# Patient Record
Sex: Male | Born: 1955 | Race: White | Hispanic: No | Marital: Married | Smoking: Former smoker
Health system: Southern US, Community
[De-identification: ages and names within clinical notes are randomized; demographics above are authoritative.]

## PROBLEM LIST (undated history)

## (undated) DIAGNOSIS — F32A Depression, unspecified: Secondary | ICD-10-CM

## (undated) DIAGNOSIS — K219 Gastro-esophageal reflux disease without esophagitis: Secondary | ICD-10-CM

## (undated) DIAGNOSIS — F1011 Alcohol abuse, in remission: Secondary | ICD-10-CM

## (undated) DIAGNOSIS — Z85828 Personal history of other malignant neoplasm of skin: Secondary | ICD-10-CM

## (undated) DIAGNOSIS — R9389 Abnormal findings on diagnostic imaging of other specified body structures: Secondary | ICD-10-CM

## (undated) DIAGNOSIS — F329 Major depressive disorder, single episode, unspecified: Secondary | ICD-10-CM

## (undated) DIAGNOSIS — E785 Hyperlipidemia, unspecified: Secondary | ICD-10-CM

## (undated) DIAGNOSIS — K4020 Bilateral inguinal hernia, without obstruction or gangrene, not specified as recurrent: Secondary | ICD-10-CM

## (undated) DIAGNOSIS — M5126 Other intervertebral disc displacement, lumbar region: Secondary | ICD-10-CM

## (undated) DIAGNOSIS — I639 Cerebral infarction, unspecified: Secondary | ICD-10-CM

## (undated) DIAGNOSIS — IMO0002 Reserved for concepts with insufficient information to code with codable children: Secondary | ICD-10-CM

## (undated) DIAGNOSIS — Z8659 Personal history of other mental and behavioral disorders: Secondary | ICD-10-CM

## (undated) HISTORY — DX: Reserved for concepts with insufficient information to code with codable children: IMO0002

## (undated) HISTORY — DX: Personal history of other malignant neoplasm of skin: Z85.828

## (undated) HISTORY — DX: Personal history of other mental and behavioral disorders: Z86.59

## (undated) HISTORY — DX: Gastro-esophageal reflux disease without esophagitis: K21.9

## (undated) HISTORY — DX: Abnormal findings on diagnostic imaging of other specified body structures: R93.89

## (undated) HISTORY — DX: Alcohol abuse, in remission: F10.11

## (undated) HISTORY — DX: Major depressive disorder, single episode, unspecified: F32.9

## (undated) HISTORY — DX: Hyperlipidemia, unspecified: E78.5

## (undated) HISTORY — DX: Other intervertebral disc displacement, lumbar region: M51.26

## (undated) HISTORY — DX: Bilateral inguinal hernia, without obstruction or gangrene, not specified as recurrent: K40.20

## (undated) HISTORY — DX: Depression, unspecified: F32.A

---

## 2003-08-10 HISTORY — PX: KNEE SURGERY: SHX244

## 2006-08-09 LAB — HM COLONOSCOPY: HM COLON: NORMAL

## 2009-04-18 ENCOUNTER — Emergency Department: Payer: Self-pay | Admitting: Emergency Medicine

## 2009-04-24 ENCOUNTER — Encounter: Admission: RE | Admit: 2009-04-24 | Discharge: 2009-04-24 | Payer: Self-pay | Admitting: Family Medicine

## 2012-12-25 ENCOUNTER — Ambulatory Visit: Payer: Self-pay | Admitting: Family Medicine

## 2014-10-21 LAB — LIPID PANEL
CHOLESTEROL: 231 mg/dL — AB (ref 0–200)
HDL: 72 mg/dL — AB (ref 35–70)
LDL CALC: 144 mg/dL
TRIGLYCERIDES: 77 mg/dL (ref 40–160)

## 2014-10-21 LAB — PSA: PSA: NORMAL

## 2014-10-28 ENCOUNTER — Ambulatory Visit: Payer: Self-pay | Admitting: Family Medicine

## 2015-04-16 ENCOUNTER — Inpatient Hospital Stay (HOSPITAL_COMMUNITY): Payer: BLUE CROSS/BLUE SHIELD

## 2015-04-16 ENCOUNTER — Observation Stay (HOSPITAL_COMMUNITY)
Admission: EM | Admit: 2015-04-16 | Discharge: 2015-04-17 | DRG: 069 | Disposition: A | Discharge status: Home / Self Care | Payer: BLUE CROSS/BLUE SHIELD | Attending: Internal Medicine | Admitting: Internal Medicine

## 2015-04-16 ENCOUNTER — Other Ambulatory Visit (HOSPITAL_COMMUNITY): Payer: BLUE CROSS/BLUE SHIELD

## 2015-04-16 ENCOUNTER — Encounter (HOSPITAL_COMMUNITY): Payer: Self-pay | Admitting: *Deleted

## 2015-04-16 ENCOUNTER — Emergency Department (HOSPITAL_COMMUNITY): Payer: BLUE CROSS/BLUE SHIELD

## 2015-04-16 ENCOUNTER — Ambulatory Visit (HOSPITAL_COMMUNITY): Payer: BLUE CROSS/BLUE SHIELD

## 2015-04-16 DIAGNOSIS — G458 Other transient cerebral ischemic attacks and related syndromes: Secondary | ICD-10-CM

## 2015-04-16 DIAGNOSIS — Z87891 Personal history of nicotine dependence: Secondary | ICD-10-CM | POA: Diagnosis not present

## 2015-04-16 DIAGNOSIS — Z880 Allergy status to penicillin: Secondary | ICD-10-CM | POA: Diagnosis not present

## 2015-04-16 DIAGNOSIS — Q231 Congenital insufficiency of aortic valve: Secondary | ICD-10-CM

## 2015-04-16 DIAGNOSIS — Z7982 Long term (current) use of aspirin: Secondary | ICD-10-CM | POA: Diagnosis not present

## 2015-04-16 DIAGNOSIS — E785 Hyperlipidemia, unspecified: Secondary | ICD-10-CM | POA: Diagnosis not present

## 2015-04-16 DIAGNOSIS — Z8673 Personal history of transient ischemic attack (TIA), and cerebral infarction without residual deficits: Secondary | ICD-10-CM | POA: Diagnosis present

## 2015-04-16 DIAGNOSIS — I10 Essential (primary) hypertension: Secondary | ICD-10-CM | POA: Diagnosis not present

## 2015-04-16 DIAGNOSIS — G459 Transient cerebral ischemic attack, unspecified: Secondary | ICD-10-CM

## 2015-04-16 LAB — DIFFERENTIAL
BASOS ABS: 0 10*3/uL (ref 0.0–0.1)
Basophils Relative: 0 % (ref 0–1)
EOS ABS: 0 10*3/uL (ref 0.0–0.7)
EOS PCT: 0 % (ref 0–5)
LYMPHS ABS: 1.4 10*3/uL (ref 0.7–4.0)
Lymphocytes Relative: 21 % (ref 12–46)
MONO ABS: 0.6 10*3/uL (ref 0.1–1.0)
MONOS PCT: 9 % (ref 3–12)
Neutro Abs: 4.5 10*3/uL (ref 1.7–7.7)
Neutrophils Relative %: 70 % (ref 43–77)

## 2015-04-16 LAB — CBC
HCT: 42.4 % (ref 39.0–52.0)
HEMATOCRIT: 42.4 % (ref 39.0–52.0)
HEMOGLOBIN: 14.4 g/dL (ref 13.0–17.0)
Hemoglobin: 14.3 g/dL (ref 13.0–17.0)
MCH: 29.3 pg (ref 26.0–34.0)
MCH: 29 pg (ref 26.0–34.0)
MCHC: 34 g/dL (ref 30.0–36.0)
MCHC: 33.7 g/dL (ref 30.0–36.0)
MCV: 86.2 fL (ref 78.0–100.0)
MCV: 86 fL (ref 78.0–100.0)
Platelets: 223 10*3/uL (ref 150–400)
Platelets: 211 10*3/uL (ref 150–400)
RBC: 4.92 MIL/uL (ref 4.22–5.81)
RBC: 4.93 MIL/uL (ref 4.22–5.81)
RDW: 13.5 % (ref 11.5–15.5)
RDW: 13.3 % (ref 11.5–15.5)
WBC: 6.5 10*3/uL (ref 4.0–10.5)
WBC: 7.6 10*3/uL (ref 4.0–10.5)

## 2015-04-16 LAB — COMPREHENSIVE METABOLIC PANEL
ALT: 17 U/L (ref 17–63)
AST: 21 U/L (ref 15–41)
Albumin: 4.2 g/dL (ref 3.5–5.0)
Alkaline Phosphatase: 61 U/L (ref 38–126)
Anion gap: 7 (ref 5–15)
BILIRUBIN TOTAL: 0.4 mg/dL (ref 0.3–1.2)
BUN: 27 mg/dL — AB (ref 6–20)
CO2: 23 mmol/L (ref 22–32)
Calcium: 9.1 mg/dL (ref 8.9–10.3)
Chloride: 107 mmol/L (ref 101–111)
Creatinine, Ser: 0.78 mg/dL (ref 0.61–1.24)
Glucose, Bld: 95 mg/dL (ref 65–99)
POTASSIUM: 4 mmol/L (ref 3.5–5.1)
Sodium: 137 mmol/L (ref 135–145)
TOTAL PROTEIN: 7.3 g/dL (ref 6.5–8.1)

## 2015-04-16 LAB — URINALYSIS, ROUTINE W REFLEX MICROSCOPIC
BILIRUBIN URINE: NEGATIVE
Glucose, UA: NEGATIVE mg/dL
HGB URINE DIPSTICK: NEGATIVE
Ketones, ur: NEGATIVE mg/dL
Leukocytes, UA: NEGATIVE
Nitrite: NEGATIVE
PH: 6.5 (ref 5.0–8.0)
Protein, ur: NEGATIVE mg/dL
SPECIFIC GRAVITY, URINE: 1.02 (ref 1.005–1.030)
UROBILINOGEN UA: 1 mg/dL (ref 0.0–1.0)

## 2015-04-16 LAB — PROTIME-INR
INR: 0.94 (ref 0.00–1.49)
Prothrombin Time: 12.8 seconds (ref 11.6–15.2)

## 2015-04-16 LAB — CREATININE, SERUM
CREATININE: 0.81 mg/dL (ref 0.61–1.24)
GFR calc Af Amer: >60 mL/min (ref >60)
GFR calc non Af Amer: >60 mL/min (ref >60)

## 2015-04-16 LAB — I-STAT TROPONIN, ED: TROPONIN I, POC: 0 ng/mL (ref 0.00–0.08)

## 2015-04-16 LAB — APTT: aPTT: 29 seconds (ref 24–37)

## 2015-04-16 MED ORDER — ASPIRIN 325 MG PO TABS
325.0000 mg | ORAL_TABLET | Freq: Once | ORAL | Status: AC
  Administered 2015-04-16: 325 mg via ORAL
  Filled 2015-04-16: qty 1

## 2015-04-16 MED ORDER — STROKE: EARLY STAGES OF RECOVERY BOOK: Freq: Once | Status: DC

## 2015-04-16 MED ORDER — ENOXAPARIN SODIUM 40 MG/0.4ML ~~LOC~~ SOLN
40.0000 mg | SUBCUTANEOUS | Status: DC
  Administered 2015-04-16: 40 mg via SUBCUTANEOUS
  Filled 2015-04-16: qty 0.4

## 2015-04-16 MED ORDER — ASPIRIN 300 MG RE SUPP: 300.0000 mg | Freq: Every day | RECTAL | Status: DC

## 2015-04-16 MED ORDER — GADOBENATE DIMEGLUMINE 529 MG/ML IV SOLN
15.0000 mL | Freq: Once | INTRAVENOUS | Status: AC | PRN
  Administered 2015-04-16: 15 mL via INTRAVENOUS

## 2015-04-16 MED ORDER — ASPIRIN 325 MG PO TABS
325.0000 mg | ORAL_TABLET | Freq: Every day | ORAL | Status: DC
  Administered 2015-04-17: 325 mg via ORAL
  Filled 2015-04-16: qty 1

## 2015-04-16 NOTE — ED Notes (Signed)
Pt reports at 0845, exact time, pt started having dizziness, tongue numbness, left arm weak, left leg feeling "heavy". Episode last 1 minute. No deficits noted at this time. Denies pain.   No medical hx, takes aspirin occasionally.

## 2015-04-16 NOTE — Progress Notes (Signed)
  Echocardiogram 2D Echocardiogram has been performed.  Raymond Chambers 04/16/2015, 3:47 PM

## 2015-04-16 NOTE — ED Provider Notes (Signed)
CSN: [id number]     Arrival date & time 04/16/15  4098 History   First MD Initiated Contact with Patient 04/16/15 [id number]     Chief Complaint  Patient presents with  . Dizziness, Arm Numbness, Tongue Numbness      (Consider location/radiation/quality/duration/timing/severity/associated sxs/prior Treatment) HPI Patient was at work when at 845 he experienced a sensation of dizziness and associated numb, heavy feeling in his left arm and leg. He also noticed a sensation of numbness in his tongue. The episode lasted for 1 minute and resolved completely. He denies any associated pain. At that time he did go to the nurse at work and they took his blood pressure which was documented as 164/99 with a heart rate of 83. Patient denies any history of hypertension. He intermittently takes a daily aspirin by the recommendation of his primary care physician. He denies having taken any aspirin today. He denies any prior history of heart disease. He does however report it runs strongly in his family. He denies any other medical history or any other medications. History reviewed. No pertinent past medical history. Past Surgical History  Procedure Laterality Date  . Knee surgery      2005 left    History reviewed. No pertinent family history. Social History  Substance Use Topics  . Smoking status: Former Smoker    Types: Pipe  . Smokeless tobacco: None  . Alcohol Use: Yes    Review of Systems 10 Systems reviewed and are negative for acute change except as noted in the HPI.    Allergies  Penicillins  Home Medications   Prior to Admission medications   Medication Sig Start Date End Date Taking? Authorizing Provider  aspirin 81 MG tablet Take 81 mg by mouth daily.   Yes Historical Provider, MD  sildenafil (VIAGRA) 100 MG tablet Take 100 mg by mouth daily as needed for erectile dysfunction.   Yes Historical Provider, MD   BP 137/89 mmHg  Pulse 60  Temp(Src) 98 F (36.7 C) (Oral)  Resp 12  SpO2  99% Physical Exam  Constitutional: He is oriented to person, place, and time. He appears well-developed and well-nourished.  HENT:  Head: Normocephalic and atraumatic.  Eyes: EOM are normal. Pupils are equal, round, and reactive to light.  Neck: Neck supple.  Cardiovascular: Normal rate, regular rhythm, normal heart sounds and intact distal pulses.   Pulmonary/Chest: Effort normal and breath sounds normal.  Abdominal: Soft. Bowel sounds are normal. He exhibits no distension. There is no tenderness.  Musculoskeletal: Normal range of motion. He exhibits no edema.  Neurological: He is alert and oriented to person, place, and time. He has normal strength. No cranial nerve deficit. He exhibits normal muscle tone. Coordination normal. GCS eye subscore is 4. GCS verbal subscore is 5. GCS motor subscore is 6.  Patient endorses symmetric sensation to light touch left to right. Speech is clear with normal content.  Skin: Skin is warm, dry and intact.  Psychiatric: He has a normal mood and affect.    ED Course  Procedures (including critical care time) Labs Review Labs Reviewed  COMPREHENSIVE METABOLIC PANEL - Abnormal; Notable for the following:    BUN 27 (*)    All other components within normal limits  PROTIME-INR  APTT  CBC  DIFFERENTIAL  URINALYSIS, ROUTINE W REFLEX MICROSCOPIC (NOT AT [facility])  Rosezena Sensor, ED    Imaging Review Ct Head Wo Contrast  04/16/2015   CLINICAL DATA:  Left-sided weakness.  EXAM: CT HEAD WITHOUT  CONTRAST  TECHNIQUE: Contiguous axial images were obtained from the base of the skull through the vertex without intravenous contrast.  COMPARISON:  None.  FINDINGS: Ventricle size normal. Negative for acute infarct. Negative for hemorrhage or mass. No cerebral edema identified. No fluid collection identified  Normal calvarium. Mild atherosclerotic calcification in the cavernous carotid bilaterally.  IMPRESSION: Negative CT of the brain.   Electronically Signed   By:  Marlan Palau M.D.   On: 04/16/2015 10:23   I have personally reviewed and evaluated these images and lab results as part of my medical decision-making.   EKG Interpretation   Date/Time:  Wednesday April 16 2015 09:41:17 EDT Ventricular Rate:  65 PR Interval:  122 QRS Duration: 90 QT Interval:  396 QTC Calculation: 412 R Axis:   49 Text Interpretation:  Sinus rhythm Ventricular premature complex Baseline  wander in lead(s) V2 V3 V6 agree. no STEMI Confirmed by Donnald Garre, MD,  Lebron Conners [phone]) on 04/16/2015 9:44:39 AM     Consultation: Dr. Amada Jupiter has done neurology consultation. Recommends admission with transfer to [facility] for continued workup. MDM   Final diagnoses:  Transient cerebral ischemia, unspecified transient cerebral ischemia type   Patient presents with classic TIA type symptoms. These had completely resolved with return to normal neurologic exam. This is a first-time episode. The patient. He will be admitted for further evaluation.    Arby Barrette, MD 04/16/15 1255

## 2015-04-16 NOTE — H&P (Signed)
PCP:   Ruel Favors, MD   Chief Complaint:  Left arm numbness, dizziness  HPI: 59 year old male with no significant medical problems who came to the ED after he experienced sensation of dizziness with numbness of left arm and heaviness of left leg. Patient also noted some numbness in the tongue. The episode lasted for about 1 minute and then totally resolved. Patient was at work when this happened. The nursing staff at work check the blood pressure which was documented 164/99. Patient does not have history of hypertension and does not take any medications for that. In the ED CT head wasn't which was negative for stroke. Patient was seen by neurology and they recommended patient to be transferred to [facility] for further evaluation of TIA. He denies chest pain, no shortness of breath. No nausea vomiting or diarrhea. No fever dysuria urgency or frequency of urination.  Allergies:   Allergies  Allergen Reactions  . Penicillins Hives     History reviewed. No pertinent past medical history.  Past Surgical History  Procedure Laterality Date  . Knee surgery      2005 left     Prior to Admission medications   Medication Sig Start Date End Date Taking? Authorizing Provider  aspirin 81 MG tablet Take 81 mg by mouth daily.   Yes Historical Provider, MD  sildenafil (VIAGRA) 100 MG tablet Take 100 mg by mouth daily as needed for erectile dysfunction.   Yes Historical Provider, MD    Social History:  reports that he has quit smoking. His smoking use included Pipe. He does not have any smokeless tobacco history on file. He reports that he drinks alcohol. He reports that he does not use illicit drugs.   Family history Patient's father had stomach cancer  All the positives are listed in BOLD  Review of Systems:  HEENT: Headache, blurred vision, runny nose, sore throat Neck: Hypothyroidism, hyperthyroidism,,lymphadenopathy Chest : Shortness of breath, history of COPD,  Asthma Heart : Chest pain, history of coronary arterey disease GI:  Nausea, vomiting, diarrhea, constipation, GERD GU: Dysuria, urgency, frequency of urination, hematuria Neuro: Stroke, seizures, syncope, dizziness Psych: Depression, anxiety, hallucinations   Physical Exam: Blood pressure 136/98, pulse 60, temperature 98 F (36.7 C), temperature source Oral, resp. rate 15, SpO2 100 %. Constitutional:   Patient is a well-developed and well-nourished male* in no acute distress and cooperative with exam. Head: Normocephalic and atraumatic Mouth: Mucus membranes moist Eyes: PERRL, EOMI, conjunctivae normal Neck: Supple, No Thyromegaly Cardiovascular: RRR, S1 normal, S2 normal Pulmonary/Chest: CTAB, no wheezes, rales, or rhonchi Abdominal: Soft. Non-tender, non-distended, bowel sounds are normal, no masses, organomegaly, or guarding present.  Neurological: A&O x3, Strength is normal and symmetric bilaterally, cranial nerve II-XII are grossly intact, no focal motor deficit, sensory intact to light touch bilaterally.  Extremities : No Cyanosis, Clubbing or Edema  Labs on Admission:  Basic Metabolic Panel:  Recent Labs Lab 04/16/15 1008  NA 137  K 4.0  CL 107  CO2 23  GLUCOSE 95  BUN 27*  CREATININE 0.78  CALCIUM 9.1   Liver Function Tests:  Recent Labs Lab 04/16/15 1008  AST 21  ALT 17  ALKPHOS 61  BILITOT 0.4  PROT 7.3  ALBUMIN 4.2   No results for input(s): LIPASE, AMYLASE in the last 168 hours. No results for input(s): AMMONIA in the last 168 hours. CBC:  Recent Labs Lab 04/16/15 1008  WBC 6.5  NEUTROABS 4.5  HGB 14.4  HCT 42.4  MCV 86.2  PLT 223    Radiological Exams on Admission: Ct Head Wo Contrast  04/16/2015   CLINICAL DATA:  Left-sided weakness.  EXAM: CT HEAD WITHOUT CONTRAST  TECHNIQUE: Contiguous axial images were obtained from the base of the skull through the vertex without intravenous contrast.  COMPARISON:  None.  FINDINGS: Ventricle size  normal. Negative for acute infarct. Negative for hemorrhage or mass. No cerebral edema identified. No fluid collection identified  Normal calvarium. Mild atherosclerotic calcification in the cavernous carotid bilaterally.  IMPRESSION: Negative CT of the brain.   Electronically Signed   By: Marlan Palau M.D.   On: 04/16/2015 10:23    EKG: Independently reviewed. Normal sinus rhythm   Assessment/Plan Active Problems:   TIA (transient ischemic attack)  TIA Patient presenting with signs and symptoms of TIA. All the symptoms and signs have resolved CT head negative for stroke Will transfer the patient to [facility] for further evaluation Will check MRI/MRA brain MRA angiogram of neck Neurology following. Continue aspirin 325 mg by mouth daily  DVT prophylaxis Lovenox  Code status: Full code  Family discussion: No family at bedside   Time Spent on Admission: 60 min  [facility] Pager: [phone] 04/16/2015, 12:18 PM  If 7PM-7AM, please contact night-coverage  [email or web address]  Password [email or web address]

## 2015-04-16 NOTE — Consult Note (Signed)
Neurology Consultation Reason for Consult: TIA Referring Physician: Donnald Garre, M  CC: TIA  History is obtained from:Patient  HPI: Raymond Chambers is a 59 y.o. male with no significant past medical history who presents with transient left-sided numbness and weakness. He states that he was walking earlier when he became dizzy. He sat down and at that point noticed that his left face arm and leg were all numb and he felt that they were heavy. He has never had anything like this happened to him before.  He denies diplopia, blurred vision, nausea or vomiting, headache.   LKW: 8:45 AM tpa given?: no, resolved symptoms    ROS: A 14 point ROS was performed and is negative except as noted in the HPI.   PMH: none   FHx: DM Heart attacke  Social History:  reports that he has quit smoking. His smoking use included Pipe. He does not have any smokeless tobacco history on file. He reports that he drinks alcohol. He reports that he does not use illicit drugs.   Exam: Current vital signs: BP 136/98 mmHg  Pulse 60  Temp(Src) 98 F (36.7 C) (Oral)  Resp 15  SpO2 100% Vital signs in last 24 hours: Temp:  [98 F (36.7 C)-98.2 F (36.8 C)] 98 F (36.7 C) (09/07 1040) Pulse Rate:  [60-90] 60 (09/07 1045) Resp:  [11-16] 15 (09/07 1100) BP: (132-155)/(87-106) 136/98 mmHg (09/07 1100) SpO2:  [97 %-100 %] 100 % (09/07 1045)   Physical Exam  Constitutional: Appears well-developed and well-nourished.  Psych: Affect appropriate to situation Eyes: No scleral injection HENT: No OP obstrucion Head: Normocephalic.  Cardiovascular: Normal rate and regular rhythm.  Respiratory: Effort normal and breath sounds normal to anterior ascultation GI: Soft.  No distension. There is no tenderness.  Skin: WDI  Neuro: Mental Status: Patient is awake, alert, oriented to person, place, month, year, and situation. Patient is able to give a clear and coherent history. No signs of aphasia or  neglect Cranial Nerves: II: Visual Fields are full. Pupils are equal, round, and reactive to light.   III,IV, VI: EOMI without ptosis or diploplia.  V: Facial sensation is symmetric to temperature VII: Facial movement is symmetric.  VIII: hearing is intact to voice X: Uvula elevates symmetrically XI: Shoulder shrug is symmetric. XII: tongue is midline without atrophy or fasciculations.  Motor: Tone is normal. Bulk is normal. 5/5 strength was present in all four extremities.  Sensory: Sensation is symmetric to light touch and temperature in the arms and legs. Deep Tendon Reflexes: 2+ and symmetric in the biceps and patellae.  Cerebellar: FNF and  intact bilaterally  I have reviewed labs in epic and the results pertinent to this consultation are: cmp - unremarkable  I have reviewed the images obtained:CT head - negative  Impression: 59 yo M with transient dizziness and left sided weakness/numbness consistent with TIA. With the dizziness, would like to better evaluate posterior circulation and therefore will recommend MRA neck rather than carotid dopplers. He will need to be evaluated for risk factor modification.   Recommendations: 1. HgbA1c, fasting lipid panel 2. MRI, MRA  of the brain without contrast, MRA neck w/wo contrast.  3. Frequent neuro checks 4. Echocardiogram 5. Carotid dopplers are not needed given MRA neck 6. Prophylactic therapy-Antiplatelet med: Aspirin - dose  PO or  PR 7. Risk factor modification 8. Telemetry monitoring 9. PT consult, OT consult, Speech consult are not needed given return to baseline.     Ritta Slot,  MD [facility] [phone]  If 7pm- 7am, please page neurology on call as listed in [place].

## 2015-04-16 NOTE — ED Notes (Signed)
Patient transported to CT 

## 2015-04-17 ENCOUNTER — Encounter: Payer: Self-pay | Admitting: Family Medicine

## 2015-04-17 ENCOUNTER — Other Ambulatory Visit: Payer: Self-pay | Admitting: Neurology

## 2015-04-17 DIAGNOSIS — I10 Essential (primary) hypertension: Secondary | ICD-10-CM

## 2015-04-17 DIAGNOSIS — K219 Gastro-esophageal reflux disease without esophagitis: Secondary | ICD-10-CM | POA: Insufficient documentation

## 2015-04-17 DIAGNOSIS — Q231 Congenital insufficiency of aortic valve: Secondary | ICD-10-CM | POA: Insufficient documentation

## 2015-04-17 DIAGNOSIS — G451 Carotid artery syndrome (hemispheric): Secondary | ICD-10-CM

## 2015-04-17 DIAGNOSIS — E785 Hyperlipidemia, unspecified: Secondary | ICD-10-CM | POA: Diagnosis not present

## 2015-04-17 DIAGNOSIS — Z85828 Personal history of other malignant neoplasm of skin: Secondary | ICD-10-CM | POA: Insufficient documentation

## 2015-04-17 DIAGNOSIS — K4020 Bilateral inguinal hernia, without obstruction or gangrene, not specified as recurrent: Secondary | ICD-10-CM | POA: Insufficient documentation

## 2015-04-17 DIAGNOSIS — R9389 Abnormal findings on diagnostic imaging of other specified body structures: Secondary | ICD-10-CM | POA: Insufficient documentation

## 2015-04-17 DIAGNOSIS — G459 Transient cerebral ischemic attack, unspecified: Secondary | ICD-10-CM | POA: Diagnosis not present

## 2015-04-17 DIAGNOSIS — F5221 Male erectile disorder: Secondary | ICD-10-CM | POA: Insufficient documentation

## 2015-04-17 DIAGNOSIS — IMO0002 Reserved for concepts with insufficient information to code with codable children: Secondary | ICD-10-CM | POA: Insufficient documentation

## 2015-04-17 DIAGNOSIS — F1021 Alcohol dependence, in remission: Secondary | ICD-10-CM | POA: Insufficient documentation

## 2015-04-17 LAB — LIPID PANEL
CHOLESTEROL: 258 mg/dL — AB (ref 0–200)
HDL: 54 mg/dL (ref >40)
LDL Cholesterol: 192 mg/dL — ABNORMAL HIGH (ref 0–99)
TRIGLYCERIDES: 61 mg/dL (ref <150)
Total CHOL/HDL Ratio: 4.8 RATIO
VLDL: 12 mg/dL (ref 0–40)

## 2015-04-17 MED ORDER — ATORVASTATIN CALCIUM 10 MG PO TABS: 20.0000 mg | ORAL_TABLET | Freq: Every day | ORAL | Status: DC

## 2015-04-17 MED ORDER — ATORVASTATIN CALCIUM 20 MG PO TABS: 20.0000 mg | ORAL_TABLET | Freq: Every day | ORAL | Status: DC

## 2015-04-17 MED ORDER — ATORVASTATIN CALCIUM 80 MG PO TABS: 80.0000 mg | ORAL_TABLET | Freq: Every day | ORAL | Status: DC

## 2015-04-17 MED ORDER — CLOPIDOGREL BISULFATE 75 MG PO TABS: 75.0000 mg | ORAL_TABLET | Freq: Every day | ORAL | Status: DC

## 2015-04-17 MED ORDER — CLOPIDOGREL BISULFATE 75 MG PO TABS
75.0000 mg | ORAL_TABLET | Freq: Every day | ORAL | Status: DC
Start: 2015-04-17 — End: 2015-04-17

## 2015-04-17 NOTE — Progress Notes (Signed)
STROKE TEAM PROGRESS NOTE   SUBJECTIVE (INTERVAL HISTORY) His wife is at the bedside.  Overall he feels his condition is completely resolved. Pt recounted his episode with me. He denies any HA but developed left UE and LE numbness with tongue numbness, also felt dizzy, and left leg heavy. Recovered in .    OBJECTIVE Temp:  [97.6 F (36.4 C)-98.2 F (36.8 C)] 98.2 F (36.8 C) (09/08 0935) Pulse Rate:  [64-81] 70 (09/08 0935) Cardiac Rhythm:  [-] Normal sinus rhythm (09/08 0700) Resp:  [16-18] 18 (09/08 0935) BP: (110-129)/(71-95) 129/95 mmHg (09/08 0935) SpO2:  [97 %-99 %] 97 % (09/08 0935)  No results for input(s): GLUCAP in the last 168 hours.  Recent Labs Lab 04/16/15 1008 04/16/15 1907  NA 137  --   K 4.0  --   CL 107  --   CO2 23  --   GLUCOSE 95  --   BUN 27*  --   CREATININE 0.78 0.81  CALCIUM 9.1  --     Recent Labs Lab 04/16/15 1008  AST 21  ALT 17  ALKPHOS 61  BILITOT 0.4  PROT 7.3  ALBUMIN 4.2    Recent Labs Lab 04/16/15 1008 04/16/15 1907  WBC 6.5 7.6  NEUTROABS 4.5  --   HGB 14.4 14.3  HCT 42.4 42.4  MCV 86.2 86.0  PLT 223 211   No results for input(s): CKTOTAL, CKMB, CKMBINDEX, TROPONINI in the last 168 hours.  Recent Labs  04/16/15 1008  LABPROT 12.8  INR 0.94    Recent Labs  04/16/15 1256  COLORURINE YELLOW  LABSPEC 1.020  PHURINE 6.5  GLUCOSEU NEGATIVE  HGBUR NEGATIVE  BILIRUBINUR NEGATIVE  KETONESUR NEGATIVE  PROTEINUR NEGATIVE  UROBILINOGEN 1.0  NITRITE NEGATIVE  LEUKOCYTESUR NEGATIVE       Component Value Date/Time   CHOL 258* 04/17/2015 0745   TRIG 61 04/17/2015 0745   HDL 54 04/17/2015 0745   CHOLHDL 4.8 04/17/2015 0745   VLDL 12 04/17/2015 0745   LDLCALC 192* 04/17/2015 0745   No results found for: HGBA1C No results found for: LABOPIA, COCAINSCRNUR, LABBENZ, AMPHETMU, THCU, LABBARB  No results for input(s): ETH in the last 168 hours.  I have personally reviewed the radiological images below and  agree with the radiology interpretations.  Dg Chest 2 View  04/16/2015   IMPRESSION: No active cardiopulmonary disease.     Ct Head Wo Contrast  04/16/2015    IMPRESSION: Negative CT of the brain.      Mri and Mra Head and Mra neck Wo Contrast  04/16/2015    IMPRESSION: 1. No acute or focal lesion to explain the patient's symptoms. 2. Scattered subcortical T2 hyperintensities are slightly greater than expected for age. The finding is nonspecific but can be seen in the setting of chronic microvascular ischemia, a demyelinating process such as multiple sclerosis, vasculitis, complicated migraine headaches, or as the sequelae of a prior infectious or inflammatory process. 3. Normal variant MRA circle of Willis without evidence for significant proximal stenosis, aneurysm, or branch vessel occlusion. 4. Negative MRA of the neck.     2D Echocardiogram  Left ventricle: The cavity size was normal. Systolic function was normal. The estimated ejection fraction was in the range of 55% to 60%. Wall motion was normal; there were no regional wall motion abnormalities. Doppler parameters are consistent with abnormal left ventricular relaxation (grade 1 diastolic dysfunction). There was no evidence of elevated ventricular filling pressure by Doppler parameters. - Aortic valve:  Bicuspid; mildly thickened leaflets. Transvalvular velocity was minimally increased. There was no stenosis. There was mild regurgitation. - Aortic root: The aortic root was normal in size. - Mitral valve: Structurally normal valve. There was mild regurgitation. - Left atrium: The atrium was normal in size. - Right ventricle: Systolic function was normal. - Right atrium: The atrium was mildly dilated. - Tricuspid valve: There was mild regurgitation. - Pulmonic valve: There was no regurgitation. - Pulmonary arteries: Systolic pressure was within the normal range. - Inferior vena cava: The vessel was normal in  size. - Pericardium, extracardiac: There was no pericardial effusion.  Impressions:  - Aortic valve is bicuspid with dilated aortic root measuring 44 mm, ascending aorta is not visualized on the current study. An alternative imaging method with CTA or MRA should be considered.  PHYSICAL EXAM  Temp:  [97.6 F (36.4 C)-98.2 F (36.8 C)] 98.2 F (36.8 C) (09/08 0935) Pulse Rate:  [64-81] 70 (09/08 0935) Resp:  [16-18] 18 (09/08 0935) BP: (110-129)/(71-95) 129/95 mmHg (09/08 0935) SpO2:  [97 %-99 %] 97 % (09/08 0935)  General - Well nourished, well developed, in no apparent distress.  Ophthalmologic - Sharp disc margins OU.  Cardiovascular - Regular rate and rhythm with no murmur.  Neck - supple, no carotid bruits  Mental Status -  Level of arousal and orientation to time, place, and person were intact. Language including expression, naming, repetition, comprehension was assessed and found intact. Attention span and concentration were normal. Recent and remote memory were intact. Fund of Knowledge was assessed and was intact.  Cranial Nerves II - XII - II - Visual field intact OU. III, IV, VI - Extraocular movements intact. V - Facial sensation intact bilaterally. VII - Facial movement intact bilaterally. VIII - Hearing & vestibular intact bilaterally. X - Palate elevates symmetrically. XI - Chin turning & shoulder shrug intact bilaterally. XII - Tongue protrusion intact.  Motor Strength - The patient's strength was normal in all extremities and pronator drift was absent.  Bulk was normal and fasciculations were absent.   Motor Tone - Muscle tone was assessed at the neck and appendages and was normal.  Reflexes - The patient's reflexes were symmetrical in all extremities and he had no pathological reflexes.  Sensory - Light touch, temperature/pinprick were assessed and were symmetrical.    Coordination - The patient had normal movements in the hands and feet with  no ataxia or dysmetria.  Tremor was absent.  Gait and Station - The patient's transfers, posture, gait, station, and turns were observed as normal.   ASSESSMENT/PLAN Raymond Chambers is a 59 y.o. male with history of HLD admitted for transient left sided numbness. Symptoms resolved.    Migraine equivalent vs. Right brain TIA   MRI  No acute stroke  MRA  Negative   2D Echo  EF 55-60%  LDL 192  HgbA1c pending  SCDs for VTE prophylaxis  Diet - low sodium heart healthy   no antithrombotic prior to admission, now on clopidogrel 75 mg orally every day. Continue plavix at home.  Patient counseled to be compliant with his antithrombotic medications  Ongoing aggressive stroke risk factor management  Hypertension  Home meds:   none BP goal normotensive  Stable  Patient counseled to be compliant with his blood pressure medications  Hyperlipidemia  Home meds:  none   LDL 192, goal < 70  Add lipitor 80  Continue statin at discharge  Other Stroke Risk Factors  Former smoker  Other Active Problems    Other Pertinent History    Hospital day # 1  Neurology will sign off. Please call with questions. Pt will follow up with Dr. Roda Shutters at [facility] in about 2 months. Thanks for the consult.  Marvel Plan, MD PhD Stroke Neurology 04/17/2015 11:02 PM    To contact Stroke Continuity provider, please refer to [email or web address]. After hours, contact General Neurology

## 2015-04-17 NOTE — Progress Notes (Signed)
SLP Cancellation Note  Patient Details Name: ZAKARIYAH FREIMARK MRN: [id number] DOB: [birth year]   Cancelled treatment:       Reason Eval/Treat Not Completed: SLP screened, no needs identified, will sign off   Blenda Mounts Laurice 04/17/2015, 10:11 AM

## 2015-04-17 NOTE — Progress Notes (Signed)
Pt discharged home with wife. Left floor ambulatory at 1415. Lawson Radar

## 2015-04-17 NOTE — Evaluation (Signed)
Physical Therapy Evaluation Patient Details Name: Raymond Chambers MRN: [id number] DOB: 13-Feb-1956 Today's Date: 04/17/2015   History of Present Illness  59 year old male with no significant medical problems who came to the ED after he experienced sensation of dizziness with numbness of left arm and heaviness of left leg. Patient also noted some numbness in the tongue  Clinical Impression  Patient presents without deficits in balance, gait or focal weakness.  Did review stroke warning signs and symptoms and methods for risk factor reduction.  No further skilled PT needs at this time.    Follow Up Recommendations No PT follow up    Equipment Recommendations  None recommended by PT    Recommendations for Other Services       Precautions / Restrictions Precautions Precautions: None      Mobility  Bed Mobility Overal bed mobility: Independent                Transfers Overall transfer level: Independent                  Ambulation/Gait Ambulation/Gait assistance: Independent Ambulation Distance (Feet): 200 Feet Assistive device: None Gait Pattern/deviations: WFL(Within Functional Limits)        Stairs Stairs: Yes Stairs assistance: Modified independent (Device/Increase time) Stair Management: One rail Right;Forwards;Alternating pattern Number of Stairs: 5    Wheelchair Mobility    Modified Rankin (Stroke Patients Only) Modified Rankin (Stroke Patients Only) Pre-Morbid Rankin Score: No symptoms Modified Rankin: Slight disability     Balance Overall balance assessment: Independent                               Standardized Balance Assessment Standardized Balance Assessment : Berg Balance Test Berg Balance Test Sit to Stand: Able to stand without using hands and stabilize independently Standing Unsupported: Able to stand safely 2 minutes Sitting with Back Unsupported but Feet Supported on Floor or Stool: Able to sit safely and securely  2 minutes Stand to Sit: Sits safely with minimal use of hands Transfers: Able to transfer safely, minor use of hands Standing Unsupported with Eyes Closed: Able to stand 10 seconds safely Standing Ubsupported with Feet Together: Able to place feet together independently and stand 1 minute safely From Standing, Reach Forward with Outstretched Arm: Can reach confidently >25 cm (10") From Standing Position, Pick up Object from Floor: Able to pick up shoe safely and easily From Standing Position, Turn to Look Behind Over each Shoulder: Looks behind from both sides and weight shifts well Turn 360 Degrees: Able to turn 360 degrees safely but slowly Standing Unsupported, Alternately Place Feet on Step/Stool: Able to stand independently and safely and complete 8 steps in 20 seconds Standing Unsupported, One Foot in Front: Able to place foot tandem independently and hold 30 seconds Standing on One Leg: Able to lift leg independently and hold > 10 seconds Total Score: 54         Pertinent Vitals/Pain Pain Assessment: No/denies pain    Home Living Family/patient expects to be discharged to:: Private residence Living Arrangements: Spouse/significant other Available Help at Discharge: Family Type of Home: House Home Access: Level entry     Home Layout: One level Home Equipment: None      Prior Function Level of Independence: Independent         Comments: works as Editor, commissioning        Extremity/Trunk Assessment  Upper Extremity Assessment: Overall WFL for tasks assessed           Lower Extremity Assessment: Overall WFL for tasks assessed         Communication   Communication: No difficulties  Cognition Arousal/Alertness: Awake/alert Behavior During Therapy: WFL for tasks assessed/performed Overall Cognitive Status: Within Functional Limits for tasks assessed                      General Comments General comments (skin integrity,  edema, etc.): Reviewed stroke warning signs and symptoms as well as need for emergent access to hospital.  Discussed appropriate symptom management based on MD recommendations for risk factor reduction    Exercises        Assessment/Plan    PT Assessment Patent does not need any further PT services  PT Diagnosis Generalized weakness   PT Problem List    PT Treatment Interventions     PT Goals (Current goals can be found in the Care Plan section) Acute Rehab PT Goals PT Goal Formulation: All assessment and education complete, DC therapy    Frequency     Barriers to discharge        Co-evaluation               End of Session   Activity Tolerance: Patient tolerated treatment well Patient left: in bed;with call bell/phone within reach;with family/visitor present      Functional Assessment Tool Used: Clinical Judgement Functional Limitation: Mobility: Walking and moving around Mobility: Walking and Moving Around Current Status (F6213): 0 percent impaired, limited or restricted Mobility: Walking and Moving Around Goal Status (Y8657): 0 percent impaired, limited or restricted Mobility: Walking and Moving Around Discharge Status [phone]): 0 percent impaired, limited or restricted    Time: 2952-8413 PT Time Calculation (min) (ACUTE ONLY): 22 min   Charges:   PT Evaluation $Initial PT Evaluation Tier I: 1 Procedure     PT G Codes:   PT G-Codes **NOT FOR INPATIENT CLASS** Functional Assessment Tool Used: Clinical Judgement Functional Limitation: Mobility: Walking and moving around Mobility: Walking and Moving Around Current Status (K4401): 0 percent impaired, limited or restricted Mobility: Walking and Moving Around Goal Status (U2725): 0 percent impaired, limited or restricted Mobility: Walking and Moving Around Discharge Status (D6644): 0 percent impaired, limited or restricted    [facility] 04/17/2015, 11:40 AM  Sheran Lawless, PT [phone] 04/17/2015

## 2015-04-17 NOTE — Discharge Summary (Addendum)
Physician Discharge Summary  DEMONTREZ RINDFLEISCH WUJ:[id number] DOB: 01-Jul-1956 DOA: 04/16/2015  PCP: Ruel Favors, MD  Admit date: 04/16/2015 Discharge date: 04/17/2015  Time spent: 30 minutes  Recommendations for Outpatient Follow-up:  1. FU with PCP for A1c management if elevated (lab results pending), HLD management, and smoking cessation.  2. Patient has bicuspid aortic valve-dilated aortic root-suspect this to be a incidental finding-further workup deferred to the outpatient setting  Discharge Diagnoses:  Active Problems:   TIA (transient ischemic attack)   Dyslipidemia   Discharge Condition: stable  Diet recommendation: Low sodium, heart healthy diet  Filed Weights   04/16/15 1356  Weight: 73.483 kg (162 lb)    History of present illness:  Justinn Welter presented to the unit from the ED on 04/16/15 for workup of TIA following an episode of dizziness, numbness/heaviness in L arm/leg, and numbness of the tongue. Episode lasted for 1 minute and resolved completely. His history includes:   Today he appears to be doing well. He is alert/oriented and responsive to commands. He is able to sit up and eat. Denies HA, vision changes, or dizziness. Denies any focal neurological deficits.   Hospital Course:  Principle Problem- TIA:  Esaw Knippel arrived to the unit on 04/16/15 for workup of TIA. Initial evaluation included fasting lipid panel, HgbA1c, MRI/MRA of brain without contrast, MRA of neck w/wo contrast all of which were WNL showing no acute abnormalities. A TTE was completed which showed an normal EF (55-60%) and normal wall motion. PT/OT/Speech consultations were not required due to return to baseline function. Patient was originally taking 81 mg Aspirin QD, but per neurology recommendations we will discontinue the daily aspirin and start Plavix 75 mg QD for CVA prevention. Patient does admit to occasional pipe-tobacco use. Education patient on smoking cessation. Patient is currently  stable for discharge home without need for additional therapy.   Active Problems:  HLD: Lipid panel completed in hospital revealed HLD (Triglycerides 258, LDL 192). We will start the patient on Lipitor 20 mg QD. FU with PCP for lipid management.  DM: As part of the TIA workup we have ordered an A1c panel (results pending). Although all other lab work does not indicate a diagnosis of diabetes, please FU with PCP when A1c results are finalized for confirmation.  Bicuspid aortic valve: Found incidentally on 2-D echocardiogram. Discussed with patient, have asked him to follow-up with his PCP.  Procedures:  TTE Consultations:  Neurology  Discharge Exam: Filed Vitals:   04/17/15 0935  BP: 129/95  Pulse: 70  Temp: 98.2 F (36.8 C)  Resp: 18    General: Well nourished, well-appearing male with  Cardiovascular: RRR, no M/R/G Respiratory: CTA b/l, no wheeze or crackles  Neuro: A&Ox3, Appropriate UE/LE movement. Strength equal b/l at 5/5 in UE/LE.   Discharge Instructions   Discharge Instructions    Call MD for:  extreme fatigue    Complete by:  As directed      Call MD for:  persistant dizziness or light-headedness    Complete by:  As directed      Diet - low sodium heart healthy    Complete by:  As directed      Increase activity slowly    Complete by:  As directed           Current Discharge Medication List    START taking these medications   Details  atorvastatin (LIPITOR) 20 MG tablet Take 1 tablet (20 mg total) by mouth daily at  6 PM. Qty: 30 tablet, Refills: 0    clopidogrel (PLAVIX) 75 MG tablet Take 1 tablet (75 mg total) by mouth daily. Qty: 30 tablet, Refills: 0      CONTINUE these medications which have NOT CHANGED   Details  sildenafil (VIAGRA) 100 MG tablet Take 100 mg by mouth daily as needed for erectile dysfunction.      STOP taking these medications     aspirin 81 MG tablet      calcium carbonate (TUMS) 500 MG chewable tablet         Allergies  Allergen Reactions  . Penicillins Hives   Follow-up Information    Follow up with Ruel Favors, MD. Schedule an appointment as soon as possible for a visit in 1 week.   Specialty:  Family Medicine   Contact information:   [address] [place] [address] [phone]       Follow up with Xu,Jindong, MD. Schedule an appointment as soon as possible for a visit in 2 months.   Specialty:  Neurology   Contact information:   [address] [place] [address] [phone]        The results of significant diagnostics from this hospitalization (including imaging, microbiology, ancillary and laboratory) are listed below for reference.    Significant Diagnostic Studies: Dg Chest 2 View  04/16/2015   CLINICAL DATA:  Weakness, transient ischemic attack  EXAM: CHEST  2 VIEW  COMPARISON:  10/28/2014  FINDINGS: The heart size and mediastinal contours are within normal limits. Both lungs are clear. The visualized skeletal structures are unremarkable. The aorta is unfolded and ectatic. Mild apical pleural thickening reidentified.  IMPRESSION: No active cardiopulmonary disease.   Electronically Signed   By: Christiana Pellant M.D.   On: 04/16/2015 18:01   Ct Head Wo Contrast  04/16/2015   CLINICAL DATA:  Left-sided weakness.  EXAM: CT HEAD WITHOUT CONTRAST  TECHNIQUE: Contiguous axial images were obtained from the base of the skull through the vertex without intravenous contrast.  COMPARISON:  None.  FINDINGS: Ventricle size normal. Negative for acute infarct. Negative for hemorrhage or mass. No cerebral edema identified. No fluid collection identified  Normal calvarium. Mild atherosclerotic calcification in the cavernous carotid bilaterally.  IMPRESSION: Negative CT of the brain.   Electronically Signed   By: Marlan Palau M.D.   On: 04/16/2015 10:23   Mr Maxine Glenn Head Wo Contrast  04/16/2015   CLINICAL DATA:  Episode of dizziness with numbness the left arm  and heaviness of the left leg the symptoms lasted 1 minute and then resolved.  EXAM: MRI HEAD WITHOUT CONTRAST  MRA HEAD WITHOUT CONTRAST  MRA NECK WITHOUT AND WITH CONTRAST  TECHNIQUE: Multiplanar, multiecho pulse sequences of the brain and surrounding structures were obtained without intravenous contrast. Angiographic images of the Circle of Willis were obtained using MRA technique without intravenous contrast. Angiographic images of the neck were obtained using MRA technique without and with intravenous contrast. Carotid stenosis measurements (when applicable) are obtained utilizing NASCET criteria, using the distal internal carotid diameter as the denominator.  CONTRAST:  15mL MULTIHANCE GADOBENATE DIMEGLUMINE 529 MG/ML IV SOLN  COMPARISON:  CT of the head without contrast 04/16/2015.  FINDINGS: MRI HEAD FINDINGS  The diffusion-weighted images demonstrate no evidence for acute or subacute infarction. No acute hemorrhage or mass lesion is present. Scattered subcortical T2 hyperintensities bilaterally are slightly greater than expected for age. There is no significant confluent white matter disease. Ventricles are of  normal size. No significant extra-axial fluid collection is present.  Flow is present in the major intracranial arteries. The globes and orbits are intact. Mild mucosal thickening is present in the ethmoid air cells. The paranasal sinuses and mastoid air cells are otherwise clear.  Skullbase is within normal limits. Midline structures are unremarkable.  MRA HEAD FINDINGS  Internal carotid arteries are within normal limits from the high cervical segments through the ICA termini. The A1 and M1 segments are normal. Anterior communicating artery is patent. The MCA bifurcations are within normal limits. ACA and MCA branch vessels are unremarkable.  The a left vertebral artery is the dominant vessel. Both AICA vessels are visualized. The basilar artery is normal. Both posterior cerebral arteries originate  from the basilar tip. The PCA branch vessels are within normal limits.  MRA NECK FINDINGS  A 3 vessel arch configuration is present. The common carotid arteries are within normal limits bilaterally. The carotid bifurcations are unremarkable. The cervical internal carotid arteries are normal.  Both vertebral arteries originate from the subclavian arteries. There are no significant stenoses.  IMPRESSION: 1. No acute or focal lesion to explain the patient's symptoms. 2. Scattered subcortical T2 hyperintensities are slightly greater than expected for age. The finding is nonspecific but can be seen in the setting of chronic microvascular ischemia, a demyelinating process such as multiple sclerosis, vasculitis, complicated migraine headaches, or as the sequelae of a prior infectious or inflammatory process. 3. Normal variant MRA circle of Willis without evidence for significant proximal stenosis, aneurysm, or branch vessel occlusion. 4. Negative MRA of the neck.   Electronically Signed   By: Marin Roberts M.D.   On: 04/16/2015 19:20   Mr Angiogram Neck W Wo Contrast  04/16/2015   CLINICAL DATA:  Episode of dizziness with numbness the left arm and heaviness of the left leg the symptoms lasted 1 minute and then resolved.  EXAM: MRI HEAD WITHOUT CONTRAST  MRA HEAD WITHOUT CONTRAST  MRA NECK WITHOUT AND WITH CONTRAST  TECHNIQUE: Multiplanar, multiecho pulse sequences of the brain and surrounding structures were obtained without intravenous contrast. Angiographic images of the Circle of Willis were obtained using MRA technique without intravenous contrast. Angiographic images of the neck were obtained using MRA technique without and with intravenous contrast. Carotid stenosis measurements (when applicable) are obtained utilizing NASCET criteria, using the distal internal carotid diameter as the denominator.  CONTRAST:  15mL MULTIHANCE GADOBENATE DIMEGLUMINE 529 MG/ML IV SOLN  COMPARISON:  CT of the head without  contrast 04/16/2015.  FINDINGS: MRI HEAD FINDINGS  The diffusion-weighted images demonstrate no evidence for acute or subacute infarction. No acute hemorrhage or mass lesion is present. Scattered subcortical T2 hyperintensities bilaterally are slightly greater than expected for age. There is no significant confluent white matter disease. Ventricles are of normal size. No significant extra-axial fluid collection is present.  Flow is present in the major intracranial arteries. The globes and orbits are intact. Mild mucosal thickening is present in the ethmoid air cells. The paranasal sinuses and mastoid air cells are otherwise clear.  Skullbase is within normal limits. Midline structures are unremarkable.  MRA HEAD FINDINGS  Internal carotid arteries are within normal limits from the high cervical segments through the ICA termini. The A1 and M1 segments are normal. Anterior communicating artery is patent. The MCA bifurcations are within normal limits. ACA and MCA branch vessels are unremarkable.  The a left vertebral artery is the dominant vessel. Both AICA vessels are visualized. The basilar artery is normal.  Both posterior cerebral arteries originate from the basilar tip. The PCA branch vessels are within normal limits.  MRA NECK FINDINGS  A 3 vessel arch configuration is present. The common carotid arteries are within normal limits bilaterally. The carotid bifurcations are unremarkable. The cervical internal carotid arteries are normal.  Both vertebral arteries originate from the subclavian arteries. There are no significant stenoses.  IMPRESSION: 1. No acute or focal lesion to explain the patient's symptoms. 2. Scattered subcortical T2 hyperintensities are slightly greater than expected for age. The finding is nonspecific but can be seen in the setting of chronic microvascular ischemia, a demyelinating process such as multiple sclerosis, vasculitis, complicated migraine headaches, or as the sequelae of a prior  infectious or inflammatory process. 3. Normal variant MRA circle of Willis without evidence for significant proximal stenosis, aneurysm, or branch vessel occlusion. 4. Negative MRA of the neck.   Electronically Signed   By: Marin Roberts M.D.   On: 04/16/2015 19:20   Mr Brain Wo Contrast  04/16/2015   CLINICAL DATA:  Episode of dizziness with numbness the left arm and heaviness of the left leg the symptoms lasted 1 minute and then resolved.  EXAM: MRI HEAD WITHOUT CONTRAST  MRA HEAD WITHOUT CONTRAST  MRA NECK WITHOUT AND WITH CONTRAST  TECHNIQUE: Multiplanar, multiecho pulse sequences of the brain and surrounding structures were obtained without intravenous contrast. Angiographic images of the Circle of Willis were obtained using MRA technique without intravenous contrast. Angiographic images of the neck were obtained using MRA technique without and with intravenous contrast. Carotid stenosis measurements (when applicable) are obtained utilizing NASCET criteria, using the distal internal carotid diameter as the denominator.  CONTRAST:  15mL MULTIHANCE GADOBENATE DIMEGLUMINE 529 MG/ML IV SOLN  COMPARISON:  CT of the head without contrast 04/16/2015.  FINDINGS: MRI HEAD FINDINGS  The diffusion-weighted images demonstrate no evidence for acute or subacute infarction. No acute hemorrhage or mass lesion is present. Scattered subcortical T2 hyperintensities bilaterally are slightly greater than expected for age. There is no significant confluent white matter disease. Ventricles are of normal size. No significant extra-axial fluid collection is present.  Flow is present in the major intracranial arteries. The globes and orbits are intact. Mild mucosal thickening is present in the ethmoid air cells. The paranasal sinuses and mastoid air cells are otherwise clear.  Skullbase is within normal limits. Midline structures are unremarkable.  MRA HEAD FINDINGS  Internal carotid arteries are within normal limits from the  high cervical segments through the ICA termini. The A1 and M1 segments are normal. Anterior communicating artery is patent. The MCA bifurcations are within normal limits. ACA and MCA branch vessels are unremarkable.  The a left vertebral artery is the dominant vessel. Both AICA vessels are visualized. The basilar artery is normal. Both posterior cerebral arteries originate from the basilar tip. The PCA branch vessels are within normal limits.  MRA NECK FINDINGS  A 3 vessel arch configuration is present. The common carotid arteries are within normal limits bilaterally. The carotid bifurcations are unremarkable. The cervical internal carotid arteries are normal.  Both vertebral arteries originate from the subclavian arteries. There are no significant stenoses.  IMPRESSION: 1. No acute or focal lesion to explain the patient's symptoms. 2. Scattered subcortical T2 hyperintensities are slightly greater than expected for age. The finding is nonspecific but can be seen in the setting of chronic microvascular ischemia, a demyelinating process such as multiple sclerosis, vasculitis, complicated migraine headaches, or as the sequelae of a prior infectious  or inflammatory process. 3. Normal variant MRA circle of Willis without evidence for significant proximal stenosis, aneurysm, or branch vessel occlusion. 4. Negative MRA of the neck.   Electronically Signed   By: Marin Roberts M.D.   On: 04/16/2015 19:20    Microbiology: No results found for this or any previous visit (from the past 240 hour(s)).   Labs: Basic Metabolic Panel:  Recent Labs Lab 04/16/15 1008 04/16/15 1907  NA 137  --   K 4.0  --   CL 107  --   CO2 23  --   GLUCOSE 95  --   BUN 27*  --   CREATININE 0.78 0.81  CALCIUM 9.1  --    Liver Function Tests:  Recent Labs Lab 04/16/15 1008  AST 21  ALT 17  ALKPHOS 61  BILITOT 0.4  PROT 7.3  ALBUMIN 4.2   No results for input(s): LIPASE, AMYLASE in the last 168 hours. No results  for input(s): AMMONIA in the last 168 hours. CBC:  Recent Labs Lab 04/16/15 1008 04/16/15 1907  WBC 6.5 7.6  NEUTROABS 4.5  --   HGB 14.4 14.3  HCT 42.4 42.4  MCV 86.2 86.0  PLT 223 211   Cardiac Enzymes: No results for input(s): CKTOTAL, CKMB, CKMBINDEX, TROPONINI in the last 168 hours. BNP: BNP (last 3 results) No results for input(s): BNP in the last 8760 hours.  ProBNP (last 3 results) No results for input(s): PROBNP in the last 8760 hours.  CBG: No results for input(s): GLUCAP in the last 168 hours.   Signed:  Connye Burkitt, Student-PA  [facility] 04/17/2015, 10:49 AM  Attending MD note  Patient was seen, examined,treatment plan was discussed with the PA-S.  I have personally reviewed the clinical findings, lab, imaging studies and management of this patient in detail. I agree with the documentation, as recorded by the PA-S.   Patient is a 59 year old male without any significant past medical history-uses tobacco once in a while-admitted with very transient left-sided numbness. MRI brain negative for acute CVA, echo without any embolic foci, carotid Doppler negative for significant stenosis. In view of significantly elevated. Spoke with neurology-Dr Xu-recommendations are to switch to Plavix (was on aspirin 81 mg at home) , started on Lipitor. Oh focal neurological deficits at all on exam-ambulating in the room without any assistance. Stable for discharge. Have instructed patient to follow-up with his PCP in the next few weeks.   [facility]

## 2015-04-17 NOTE — Progress Notes (Signed)
Utilization review completed. Maynard David, RN, BSN. 

## 2015-04-18 ENCOUNTER — Telehealth: Payer: Self-pay | Admitting: Family Medicine

## 2015-04-18 LAB — HEMOGLOBIN A1C
Hgb A1c MFr Bld: 5.5 % (ref 4.8–5.6)
Mean Plasma Glucose: 111 mg/dL

## 2015-04-18 NOTE — Telephone Encounter (Signed)
They added 2 new medications atorvastin 80 mg and clopidogrel  and is doing well

## 2015-04-18 NOTE — Telephone Encounter (Signed)
Pt called stating he had a mini stroke and was in the ER over night at [facility] and wanted you to be aware before his appt on 04/21/15.

## 2015-04-18 NOTE — Telephone Encounter (Signed)
Please call patient, how is he doing? Any change in medication?

## 2015-04-21 ENCOUNTER — Ambulatory Visit (INDEPENDENT_AMBULATORY_CARE_PROVIDER_SITE_OTHER): Payer: BLUE CROSS/BLUE SHIELD | Admitting: Family Medicine

## 2015-04-21 ENCOUNTER — Encounter: Payer: Self-pay | Admitting: Family Medicine

## 2015-04-21 VITALS — BP 118/82 | HR 86 | Temp 98.2°F | Resp 18 | Ht 71.0 in | Wt 162.4 lb

## 2015-04-21 DIAGNOSIS — Z8673 Personal history of transient ischemic attack (TIA), and cerebral infarction without residual deficits: Secondary | ICD-10-CM

## 2015-04-21 DIAGNOSIS — E785 Hyperlipidemia, unspecified: Secondary | ICD-10-CM

## 2015-04-21 DIAGNOSIS — Q231 Congenital insufficiency of aortic valve: Secondary | ICD-10-CM

## 2015-04-21 DIAGNOSIS — Z85828 Personal history of other malignant neoplasm of skin: Secondary | ICD-10-CM | POA: Diagnosis not present

## 2015-04-21 DIAGNOSIS — Z23 Encounter for immunization: Secondary | ICD-10-CM | POA: Diagnosis not present

## 2015-04-21 DIAGNOSIS — D692 Other nonthrombocytopenic purpura: Secondary | ICD-10-CM | POA: Diagnosis not present

## 2015-04-21 DIAGNOSIS — Z09 Encounter for follow-up examination after completed treatment for conditions other than malignant neoplasm: Secondary | ICD-10-CM

## 2015-04-21 MED ORDER — ATORVASTATIN CALCIUM 80 MG PO TABS: 80.0000 mg | ORAL_TABLET | Freq: Every day | ORAL | Status: DC

## 2015-04-21 MED ORDER — CLOPIDOGREL BISULFATE 75 MG PO TABS: 75.0000 mg | ORAL_TABLET | Freq: Every day | ORAL | Status: DC

## 2015-04-21 NOTE — Progress Notes (Signed)
Name: Raymond Chambers   MRN: [id number]    DOB: 1956/02/17   Date:04/21/2015       Progress Note  Subjective  Chief Complaint  Chief Complaint  Patient presents with  . Referral    Cardiology-Was in the ER on 04/16/2015 due to having a Mini-TIA and found a Heart Murmur.   Marland Kitchen Hospitalization Follow-up    Patient went into [facility] and then was transferred into [facility]. While admitted patient started on 2 new medications and referral to Neurologist.    HPI  TIA: he was at work on 09/07 and developed acute onset of dizziness. Followed by left facial tingling, left leg and arm tingling and heaviness, the episode lasted about 2 minutes . He went to [facility] and transferred to [facility], he had multiple tests done, abnormal findings ( stable mild apical pleural thickening and bicuspid Aortic Valve, increase in in subcortical hyper intensities slightly greater than expected for age) and elevated LDL. He has been asymptomatic since discharge from hospital. He is taking Atorvastatin and Plavix as recommended. He has noticed bruising on both arms  Skin lesion: previous history of skin cancer, and has two new lesions that are not healing on left arm, needs referral to Dermatologist   Patient Active Problem List   Diagnosis Date Noted  . Abnormal chest x-ray 04/17/2015  . Alcohol abuse, in remission 04/17/2015  . 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 Acid reflux 04/17/2015  . Herniation of nucleus pulposus 04/17/2015  . Bilateral inguinal hernia 04/17/2015  . H/O malignant neoplasm of skin 04/17/2015  . Bicuspid aortic valve 04/17/2015  . HLD (hyperlipidemia)   . Hx of transient ischemic attack (TIA) 04/16/2015    Past Surgical History  Procedure Laterality Date  . Knee surgery      2005 left     Family History  Problem Relation Age of Onset  . Cancer Father     Stomach   . Alcohol abuse Father   . Heart disease Father   . Diabetes Maternal Grandmother      Social History   Social History  . Marital Status: Married    Spouse Name: N/A  . Number of Children: N/A  . Years of Education: N/A   Occupational History  . Not on file.   Social History Main Topics  . Smoking status: Former Smoker -- 20 years    Types: Pipe    Start date: 08/09/1993    Quit date: 08/09/2013  . Smokeless tobacco: Never Used  . Alcohol Use: 0.0 oz/week    0 Standard drinks or equivalent per week  . Drug Use: No  . Sexual Activity:    Partners: Female   Other Topics Concern  . Not on file   Social History Narrative     Current outpatient prescriptions:  .  atorvastatin (LIPITOR) 80 MG tablet, Take 1 tablet (80 mg total) by mouth daily at 6 PM., Disp: 90 tablet, Rfl: 1 .  clopidogrel (PLAVIX) 75 MG tablet, Take 1 tablet (75 mg total) by mouth daily., Disp: 90 tablet, Rfl: 1 .  sildenafil (VIAGRA) 100 MG tablet, Take 100 mg by mouth daily as needed for erectile dysfunction., Disp: , Rfl:   Allergies  Allergen Reactions  . Penicillins Hives     ROS  Constitutional: Negative for fever or weight change.  Respiratory: Negative for cough and shortness of breath.   Cardiovascular: Negative for chest pain or palpitations.  Gastrointestinal: Negative for  abdominal pain, no bowel changes.  Musculoskeletal: Negative for gait problem or joint swelling.  Skin: Negative for rash.  Neurological: Negative for dizziness or headache.  No other specific complaints in a complete review of systems (except as listed in HPI above).  Objective  Filed Vitals:   04/21/15 1142  BP: 118/82  Pulse: 86  Temp: 98.2 F (36.8 C)  TempSrc: Oral  Resp: 18  Height: $Remove'5\' 11"'FRUAXvw$  (1.803 m)  Weight: 162 lb 6.4 oz (73.664 kg)  SpO2: 98%    Body mass index is 22.66 kg/(m^2).  Physical Exam  Constitutional: Patient appears well-developed and well-nourished.  No distress.  HEENT: head atraumatic, normocephalic, pupils equal and reactive to light,  neck supple, throat  within normal limits Cardiovascular: Normal rate, regular rhythm and normal heart sounds.  No murmur heard. No BLE edema. Pulmonary/Chest: Effort normal and breath sounds normal. No respiratory distress. Abdominal: Soft.  There is no tenderness. Psychiatric: Patient has a normal mood and affect. behavior is normal. Judgment and thought content normal. Neurological: normal exam, normal sensation, normal reflexes, and negative Romberg  Recent Results (from the past 2160 hour(s))  Protime-INR     Status: None   Collection Time: 04/16/15 10:08 AM  Result Value Ref Range   Prothrombin Time 12.8 11.6 - 15.2 seconds   INR 0.94 0.00 - 1.49  APTT     Status: None   Collection Time: 04/16/15 10:08 AM  Result Value Ref Range   aPTT 29 24 - 37 seconds  CBC     Status: None   Collection Time: 04/16/15 10:08 AM  Result Value Ref Range   WBC 6.5 4.0 - 10.5 K/uL   RBC 4.92 4.22 - 5.81 MIL/uL   Hemoglobin 14.4 13.0 - 17.0 g/dL   HCT 42.4 39.0 - 52.0 %   MCV 86.2 78.0 - 100.0 fL   MCH 29.3 26.0 - 34.0 pg   MCHC 34.0 30.0 - 36.0 g/dL   RDW 13.5 11.5 - 15.5 %   Platelets 223 150 - 400 K/uL  Differential     Status: None   Collection Time: 04/16/15 10:08 AM  Result Value Ref Range   Neutrophils Relative % 70 43 - 77 %   Neutro Abs 4.5 1.7 - 7.7 K/uL   Lymphocytes Relative 21 12 - 46 %   Lymphs Abs 1.4 0.7 - 4.0 K/uL   Monocytes Relative 9 3 - 12 %   Monocytes Absolute 0.6 0.1 - 1.0 K/uL   Eosinophils Relative 0 0 - 5 %   Eosinophils Absolute 0.0 0.0 - 0.7 K/uL   Basophils Relative 0 0 - 1 %   Basophils Absolute 0.0 0.0 - 0.1 K/uL  Comprehensive metabolic panel     Status: Abnormal   Collection Time: 04/16/15 10:08 AM  Result Value Ref Range   Sodium 137 135 - 145 mmol/L   Potassium 4.0 3.5 - 5.1 mmol/L   Chloride 107 101 - 111 mmol/L   CO2 23 22 - 32 mmol/L   Glucose, Bld 95 65 - 99 mg/dL   BUN 27 (H) 6 - 20 mg/dL   Creatinine, Ser 0.78 0.61 - 1.24 mg/dL   Calcium 9.1 8.9 - 10.3 mg/dL    Total Protein 7.3 6.5 - 8.1 g/dL   Albumin 4.2 3.5 - 5.0 g/dL   AST 21 15 - 41 U/L   ALT 17 17 - 63 U/L   Alkaline Phosphatase 61 38 - 126 U/L   Total Bilirubin 0.4 0.3 -  1.2 mg/dL   GFR calc non Af Amer >60 >60 mL/min   GFR calc Af Amer >60 >60 mL/min    Comment: (NOTE) The eGFR has been calculated using the CKD EPI equation. This calculation has not been validated in all clinical situations. eGFR's persistently <60 mL/min signify possible Chronic Kidney Disease.    Anion gap 7 5 - 15  I-stat troponin, ED (not at [facility])     Status: None   Collection Time: 04/16/15 10:15 AM  Result Value Ref Range   Troponin i, poc 0.00 0.00 - 0.08 ng/mL   Comment 3            Comment: Due to the release kinetics of cTnI, a negative result within the first hours of the onset of symptoms does not rule out myocardial infarction with certainty. If myocardial infarction is still suspected, repeat the test at appropriate intervals.   Urinalysis, Routine w reflex microscopic (not at [facility])     Status: None   Collection Time: 04/16/15 12:56 PM  Result Value Ref Range   Color, Urine YELLOW YELLOW   APPearance CLEAR CLEAR   Specific Gravity, Urine 1.020 1.005 - 1.030   pH 6.5 5.0 - 8.0   Glucose, UA NEGATIVE NEGATIVE mg/dL   Hgb urine dipstick NEGATIVE NEGATIVE   Bilirubin Urine NEGATIVE NEGATIVE   Ketones, ur NEGATIVE NEGATIVE mg/dL   Protein, ur NEGATIVE NEGATIVE mg/dL   Urobilinogen, UA 1.0 0.0 - 1.0 mg/dL   Nitrite NEGATIVE NEGATIVE   Leukocytes, UA NEGATIVE NEGATIVE    Comment: MICROSCOPIC NOT DONE ON URINES WITH NEGATIVE PROTEIN, BLOOD, LEUKOCYTES, NITRITE, OR GLUCOSE <1000 mg/dL.  CBC     Status: None   Collection Time: 04/16/15  7:07 PM  Result Value Ref Range   WBC 7.6 4.0 - 10.5 K/uL   RBC 4.93 4.22 - 5.81 MIL/uL   Hemoglobin 14.3 13.0 - 17.0 g/dL   HCT 42.4 39.0 - 52.0 %   MCV 86.0 78.0 - 100.0 fL   MCH 29.0 26.0 - 34.0 pg   MCHC 33.7 30.0 - 36.0 g/dL   RDW 13.3 11.5 -  15.5 %   Platelets 211 150 - 400 K/uL  Creatinine, serum     Status: None   Collection Time: 04/16/15  7:07 PM  Result Value Ref Range   Creatinine, Ser 0.81 0.61 - 1.24 mg/dL   GFR calc non Af Amer >60 >60 mL/min   GFR calc Af Amer >60 >60 mL/min    Comment: (NOTE) The eGFR has been calculated using the CKD EPI equation. This calculation has not been validated in all clinical situations. eGFR's persistently <60 mL/min signify possible Chronic Kidney Disease.   Hemoglobin A1c     Status: None   Collection Time: 04/17/15  7:45 AM  Result Value Ref Range   Hgb A1c MFr Bld 5.5 4.8 - 5.6 %    Comment: (NOTE)         Pre-diabetes: 5.7 - 6.4         Diabetes: >6.4         Glycemic control for adults with diabetes: <7.0    Mean Plasma Glucose 111 mg/dL    Comment: (NOTE) Performed At: [facility] [place], [place] [id number] Lindon Romp MD [id number]   Lipid panel     Status: Abnormal   Collection Time: 04/17/15  7:45 AM  Result Value Ref Range   Cholesterol 258 (H) 0 - 200 mg/dL  Triglycerides 61 <150 mg/dL   HDL 54 >40 mg/dL   Total CHOL/HDL Ratio 4.8 RATIO   VLDL 12 0 - 40 mg/dL   LDL Cholesterol 192 (H) 0 - 99 mg/dL    Comment:        Total Cholesterol/HDL:CHD Risk Coronary Heart Disease Risk Table                     Men   Women  1/2 Average Risk   3.4   3.3  Average Risk       5.0   4.4  2 X Average Risk   9.6   7.1  3 X Average Risk  23.4   11.0        Use the calculated Patient Ratio above and the CHD Risk Table to determine the patient's CHD Risk.        ATP III CLASSIFICATION (LDL):  <100     mg/dL   Optimal  100-129  mg/dL   Near or Above                    Optimal  130-159  mg/dL   Borderline  160-189  mg/dL   High  >190     mg/dL   Very High      PHQ2/9: Depression screen [facility] 2/9 04/21/2015  Decreased Interest 0  Down, Depressed, Hopeless 0  PHQ - 2 Score 0     Fall Risk: Fall Risk  04/21/2015  Falls in the  past year? No      Functional Status Survey: Is the patient deaf or have difficulty hearing?: No Does the patient have difficulty seeing, even when wearing glasses/contacts?: Yes (glasses) Does the patient have difficulty concentrating, remembering, or making decisions?: No Does the patient have difficulty walking or climbing stairs?: No Does the patient have difficulty dressing or bathing?: No Does the patient have difficulty doing errands alone such as visiting a doctor's office or shopping?: No    Assessment & Plan  1. Hx of transient ischemic attack (TIA) Doing well, keep follow up appointment with neurologist. Also scattered subcortical T2 hype intensities and will follow up with neurologist, carotids within normal limits  - atorvastatin (LIPITOR) 80 MG tablet; Take 1 tablet (80 mg total) by mouth daily at 6 PM.  Dispense: 90 tablet; Refill: 1 - clopidogrel (PLAVIX) 75 MG tablet; Take 1 tablet (75 mg total) by mouth daily.  Dispense: 90 tablet; Refill: 1  2. Needs flu shot  - Flu Vaccine QUAD 36+ mos PF IM (Fluarix & Fluzone Quad PF)  3. Hospital discharge follow-up Doing well since discharge  4. H/O malignant neoplasm of skin  - Ambulatory referral to Dermatology  5. Bicuspid aortic valve Found during hospital stay, refer to cardiologist - Ambulatory referral to Cardiology  6. HLD (hyperlipidemia) Recheck labs in 3 months  7. Senile purpura

## 2015-05-30 ENCOUNTER — Other Ambulatory Visit: Payer: Self-pay | Admitting: Family Medicine

## 2015-05-30 DIAGNOSIS — Z8673 Personal history of transient ischemic attack (TIA), and cerebral infarction without residual deficits: Secondary | ICD-10-CM

## 2015-05-30 MED ORDER — CLOPIDOGREL BISULFATE 75 MG PO TABS: 75.0000 mg | ORAL_TABLET | Freq: Every day | ORAL | Status: DC

## 2015-05-30 NOTE — Telephone Encounter (Signed)
Completely out of clopidogrel 75mg  and is requesting that it go to [facility] [address]

## 2015-06-02 ENCOUNTER — Telehealth: Payer: Self-pay

## 2015-06-09 NOTE — Telephone Encounter (Signed)
error 

## 2015-06-11 ENCOUNTER — Ambulatory Visit: Payer: Self-pay | Admitting: Neurology

## 2015-06-12 ENCOUNTER — Encounter: Payer: Self-pay | Admitting: Neurology

## 2015-06-16 ENCOUNTER — Ambulatory Visit: Payer: BLUE CROSS/BLUE SHIELD | Admitting: Cardiovascular Disease

## 2015-06-17 ENCOUNTER — Encounter: Payer: Self-pay | Admitting: *Deleted

## 2015-07-21 ENCOUNTER — Ambulatory Visit: Payer: BLUE CROSS/BLUE SHIELD | Admitting: Family Medicine

## 2015-10-10 ENCOUNTER — Ambulatory Visit: Payer: BLUE CROSS/BLUE SHIELD | Admitting: Family Medicine

## 2015-11-07 ENCOUNTER — Ambulatory Visit: Payer: BLUE CROSS/BLUE SHIELD | Admitting: Family Medicine

## 2015-11-28 ENCOUNTER — Ambulatory Visit (INDEPENDENT_AMBULATORY_CARE_PROVIDER_SITE_OTHER): Payer: BLUE CROSS/BLUE SHIELD | Admitting: Family Medicine

## 2015-11-28 ENCOUNTER — Encounter: Payer: Self-pay | Admitting: Family Medicine

## 2015-11-28 VITALS — BP 116/74 | HR 83 | Temp 98.5°F | Resp 16 | Ht 71.0 in | Wt 158.3 lb

## 2015-11-28 DIAGNOSIS — W57XXXA Bitten or stung by nonvenomous insect and other nonvenomous arthropods, initial encounter: Secondary | ICD-10-CM | POA: Diagnosis not present

## 2015-11-28 DIAGNOSIS — M62830 Muscle spasm of back: Secondary | ICD-10-CM

## 2015-11-28 DIAGNOSIS — Z85828 Personal history of other malignant neoplasm of skin: Secondary | ICD-10-CM

## 2015-11-28 DIAGNOSIS — F1021 Alcohol dependence, in remission: Secondary | ICD-10-CM | POA: Diagnosis not present

## 2015-11-28 DIAGNOSIS — Z8673 Personal history of transient ischemic attack (TIA), and cerebral infarction without residual deficits: Secondary | ICD-10-CM | POA: Diagnosis not present

## 2015-11-28 DIAGNOSIS — D692 Other nonthrombocytopenic purpura: Secondary | ICD-10-CM

## 2015-11-28 DIAGNOSIS — E785 Hyperlipidemia, unspecified: Secondary | ICD-10-CM

## 2015-11-28 DIAGNOSIS — Z125 Encounter for screening for malignant neoplasm of prostate: Secondary | ICD-10-CM | POA: Diagnosis not present

## 2015-11-28 DIAGNOSIS — T148 Other injury of unspecified body region: Secondary | ICD-10-CM

## 2015-11-28 DIAGNOSIS — Z23 Encounter for immunization: Secondary | ICD-10-CM | POA: Diagnosis not present

## 2015-11-28 DIAGNOSIS — Z79899 Other long term (current) drug therapy: Secondary | ICD-10-CM | POA: Diagnosis not present

## 2015-11-28 MED ORDER — TRIAMCINOLONE ACETONIDE 0.1 % EX CREA: 1.0000 "application " | TOPICAL_CREAM | Freq: Two times a day (BID) | CUTANEOUS | Status: DC

## 2015-11-28 MED ORDER — CLOPIDOGREL BISULFATE 75 MG PO TABS: 75.0000 mg | ORAL_TABLET | Freq: Every day | ORAL | Status: DC

## 2015-11-28 MED ORDER — ATORVASTATIN CALCIUM 80 MG PO TABS
80.0000 mg | ORAL_TABLET | Freq: Every day | ORAL | Status: DC
Start: 2015-11-28 — End: 2016-05-28

## 2015-11-28 NOTE — Progress Notes (Signed)
Name: Raymond Chambers   MRN: [id number]    DOB: 01/05/1956   Date:11/28/2015       Progress Note  Subjective  Chief Complaint  Chief Complaint  Patient presents with  . Labs Only    cholesterol check  . Medication Refill    plavix, atorvastatin   . Insect Bite    right upper arm. very itchy. first noticed a few days ago  . Spasms    patient has had some back spasms recently. hx of sciatica and back pain while in the [employer].    HPI  TIA: he was at work on 09/07 and developed acute onset of dizziness. Followed by left facial tingling, left leg and arm tingling and heaviness, the episode lasted about 2 minutes . He went to [facility] and [facility], he had multiple tests done, abnormal findings ( stable mild apical pleural thickening and bicuspid Aortic Valve, increase in in subcortical hyper intensities slightly greater than expected for age) and elevated LDL. He has been asymptomatic since discharge from hospital. When he was seen here in 09 he was not taking Atorvastatin, but he is back on daily medication and has been compliant with Plavix. Yesterday had some myoclonus right upper lip that lasted at most one minute, and today had some dizziness for a second. Explained that if symptoms gets worse needs to go to [facility]. He is feeling well at this time  ED: wife is concerned about possible side effects of Viagra, they are sexually active and will discuss it further before resuming medication  Spasm back: he has history of back injury in the [employer] in the 1980's and had radiculitis down left leg, currently having intermittent back spasm that are brief, and it happens about once a week, resolves by itself. Discussed muscle relaxer or exercises, and he chose the later.   Insect Bite: he has an acre of land and noticed an area on right axilla that was pruritus and had a bump, followed by some redness around. He has been trying to scratch, redness is no longer progressing.   Skin lesion:  previous history of skin cancer, and has two new lesions that are not healing on left arm, needs referral to Dermatologist   Patient Active Problem List   Diagnosis Date Noted  . History of alcoholism (HCC) 04/17/2015  . 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 Acid reflux 04/17/2015  . Herniation of nucleus pulposus 04/17/2015  . Bilateral inguinal hernia 04/17/2015  . H/O malignant neoplasm of skin 04/17/2015  . Bicuspid aortic valve 04/17/2015  . HLD (hyperlipidemia)   . Hx of transient ischemic attack (TIA) 04/16/2015    Past Surgical History  Procedure Laterality Date  . Knee surgery      2005 left     Family History  Problem Relation Age of Onset  . Cancer Father     Stomach   . Alcohol abuse Father   . Heart disease Father   . Diabetes Maternal Grandmother     Social History   Social History  . Marital Status: Married    Spouse Name: N/A  . Number of Children: N/A  . Years of Education: N/A   Occupational History  . Not on file.   Social History Main Topics  . Smoking status: Former Smoker -- 20 years    Types: Pipe    Start date: 08/09/1993    Quit date: 08/09/2013  . Smokeless tobacco: Never Used  .  Alcohol Use: 0.6 oz/week    0 Standard drinks or equivalent, 1 Glasses of wine per week  . Drug Use: No  . Sexual Activity:    Partners: Female   Other Topics Concern  . Not on file   Social History Narrative     Current outpatient prescriptions:  .  atorvastatin (LIPITOR) 80 MG tablet, Take 1 tablet (80 mg total) by mouth daily at 6 PM., Disp: 90 tablet, Rfl: 1 .  clopidogrel (PLAVIX) 75 MG tablet, Take 1 tablet (75 mg total) by mouth daily., Disp: 90 tablet, Rfl: 1 .  sildenafil (VIAGRA) 100 MG tablet, Take 100 mg by mouth daily as needed for erectile dysfunction., Disp: , Rfl:   Allergies  Allergen Reactions  . Penicillins Hives     ROS  Ten systems reviewed and is negative except as mentioned in HPI    Objective  Filed Vitals:   11/28/15 1443  BP: 116/74  Pulse: 83  Temp: 98.5 F (36.9 C)  TempSrc: Oral  Resp: 16  Height:  (1.803 m)  Weight: 158 lb 4.8 oz (71.804 kg)  SpO2: 96%    Body mass index is 22.09 kg/(m^2).  Physical Exam  Constitutional: Patient appears well-developed and well-nourished.  No distress.  HEENT: head atraumatic, normocephalic, pupils equal and reactive to light, eneck supple, throat within normal limits Cardiovascular: Normal rate, regular rhythm and normal heart sounds.  No murmur heard. No BLE edema. Pulmonary/Chest: Effort normal and breath sounds normal. No respiratory distress. Abdominal: Soft.  There is no tenderness. Psychiatric: Patient has a normal mood and affect. behavior is normal. Judgment and thought content normal. Neurological: normal cranial nerves, normal sensation, romberg negative Skin: port of entry noticed on right axilla, large area of erythematous macular on right inner arm  PHQ2/9: Depression screen [facility] 2/9 11/28/2015 04/21/2015  Decreased Interest 0 0  Down, Depressed, Hopeless 0 0  PHQ - 2 Score 0 0     Fall Risk: Fall Risk  11/28/2015 04/21/2015  Falls in the past year? No No      Functional Status Survey: Is the patient deaf or have difficulty hearing?: No Does the patient have difficulty seeing, even when wearing glasses/contacts?: No Does the patient have difficulty concentrating, remembering, or making decisions?: No (patient has a hx of ADD) Does the patient have difficulty walking or climbing stairs?: No Does the patient have difficulty dressing or bathing?: No Does the patient have difficulty doing errands alone such as visiting a doctor's office or shopping?: No   Assessment & Plan  1. Hx of transient ischemic attack (TIA)  - atorvastatin (LIPITOR) 80 MG tablet; Take 1 tablet (80 mg total) by mouth daily at 6 PM.  Dispense: 90 tablet; Refill: 1 - clopidogrel (PLAVIX) 75 MG tablet; Take 1 tablet  (75 mg total) by mouth daily.  Dispense: 90 tablet; Refill: 1  2. H/O malignant neoplasm of skin  - Ambulatory referral to Dermatology  3. Senile purpura (HCC)   4. HLD (hyperlipidemia)  Resume medication, he has been out for about one week  5. History of alcoholism (HCC)  Doing well, quit in the 90's only drinking one glass of wine at most once a week  6. Need for Streptococcus pneumoniae vaccination  - Pneumococcal polysaccharide vaccine 23-valent greater than or equal to 2yo subcutaneous/IM  7. Back muscle spasm  Try home exercises  8. Insect bite  Can also try otc allergy medication  - triamcinolone cream (KENALOG) 0.1 %; Apply 1  application topically 2 (two) times daily.  Dispense: 30 g; Refill: 0

## 2015-11-28 NOTE — Patient Instructions (Signed)

## 2016-01-02 ENCOUNTER — Telehealth: Payer: Self-pay | Admitting: Family Medicine

## 2016-01-21 NOTE — Telephone Encounter (Signed)
errenous °

## 2016-03-05 ENCOUNTER — Encounter: Payer: BLUE CROSS/BLUE SHIELD | Admitting: Family Medicine

## 2016-04-17 ENCOUNTER — Other Ambulatory Visit: Payer: Self-pay | Admitting: Family Medicine

## 2016-04-17 DIAGNOSIS — Z8673 Personal history of transient ischemic attack (TIA), and cerebral infarction without residual deficits: Secondary | ICD-10-CM

## 2016-04-20 NOTE — Telephone Encounter (Signed)
Patient already had appt scheduled for next month. Also lvm informing patient prescription has been sent to pharmacy.

## 2016-05-28 ENCOUNTER — Encounter: Payer: Self-pay | Admitting: Family Medicine

## 2016-05-28 ENCOUNTER — Ambulatory Visit (INDEPENDENT_AMBULATORY_CARE_PROVIDER_SITE_OTHER): Payer: BLUE CROSS/BLUE SHIELD | Admitting: Family Medicine

## 2016-05-28 VITALS — BP 130/80 | HR 74 | Temp 98.0°F | Resp 16 | Ht 71.0 in | Wt 165.1 lb

## 2016-05-28 DIAGNOSIS — F1021 Alcohol dependence, in remission: Secondary | ICD-10-CM

## 2016-05-28 DIAGNOSIS — Z79899 Other long term (current) drug therapy: Secondary | ICD-10-CM

## 2016-05-28 DIAGNOSIS — E7800 Pure hypercholesterolemia, unspecified: Secondary | ICD-10-CM | POA: Diagnosis not present

## 2016-05-28 DIAGNOSIS — Z131 Encounter for screening for diabetes mellitus: Secondary | ICD-10-CM | POA: Diagnosis not present

## 2016-05-28 DIAGNOSIS — D692 Other nonthrombocytopenic purpura: Secondary | ICD-10-CM

## 2016-05-28 DIAGNOSIS — Z8673 Personal history of transient ischemic attack (TIA), and cerebral infarction without residual deficits: Secondary | ICD-10-CM

## 2016-05-28 DIAGNOSIS — Z1211 Encounter for screening for malignant neoplasm of colon: Secondary | ICD-10-CM | POA: Diagnosis not present

## 2016-05-28 DIAGNOSIS — Z85828 Personal history of other malignant neoplasm of skin: Secondary | ICD-10-CM | POA: Diagnosis not present

## 2016-05-28 DIAGNOSIS — K130 Diseases of lips: Secondary | ICD-10-CM | POA: Diagnosis not present

## 2016-05-28 MED ORDER — CLOPIDOGREL BISULFATE 75 MG PO TABS
75.0000 mg | ORAL_TABLET | Freq: Every day | ORAL | 1 refills | Status: DC
Start: 2016-05-28 — End: 2017-07-09

## 2016-05-28 MED ORDER — ATORVASTATIN CALCIUM 80 MG PO TABS: 80.0000 mg | ORAL_TABLET | Freq: Every day | ORAL | 1 refills | Status: DC

## 2016-05-28 NOTE — Progress Notes (Addendum)
Name: Raymond ChinaJeffrey A Chambers   MRN: [id number]    DOB: 12/08/1955   Date:05/28/2016       Progress Note  Subjective  Chief Complaint  Chief Complaint  Patient presents with  . Hyperlipidemia  . Mouth Lesions    HPI  TIA: he was at work on 04/16/2015 and developed acute onset of dizziness. Followed by left facial tingling, left leg and arm tingling and heaviness, the episode lasted about 2 minutes . [facility] and [facility], he had multiple tests done, abnormal findings ( stable mild apical pleural thickening and bicuspid Aortic Valve, increase in in subcortical hyper intensities slightly greater than expected for age) and elevated LDL. He has been asymptomatic since discharge from hospital. When he was seen here in 09 he was not taking Atorvastatin, but he is back on daily medication and has been compliant with Plavix. He had one episode of myoclonus eye back in the Spring but resolved. Only side effects is easy bruising  ED: wife is concerned about possible side effects of Viagra, they are sexually active and states no problems at this time  Skin lesion: previous history of skin cancer, and over the past few weeks he has noticed a white lesion on left lower lip, he used to smoke a pipe years ago but quit  Alcoholism history: only drinks occasional wine since 06/1988. Prior to that he used to binge drink beer - while in [employer], he had 3 DUI's and that is when he quit drinking.   Senile purpura: stable, mostly on back on left hand  Patient Active Problem List   Diagnosis Date Noted  . History of alcoholism (HCC) 04/17/2015  . 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 Acid reflux 04/17/2015  . Herniation of nucleus pulposus 04/17/2015  . Bilateral inguinal hernia 04/17/2015  . H/O malignant neoplasm of skin 04/17/2015  . Bicuspid aortic valve 04/17/2015  . HLD (hyperlipidemia)   . Hx of transient ischemic attack (TIA) 04/16/2015    Past  Surgical History:  Procedure Laterality Date  . KNEE SURGERY     2005 left     Family History  Problem Relation Age of Onset  . Cancer Father     Stomach   . Alcohol abuse Father   . Heart disease Father   . Diabetes Maternal Grandmother     Social History   Social History  . Marital status: Married    Spouse name: N/A  . Number of children: N/A  . Years of education: N/A   Occupational History  . Not on file.   Social History Main Topics  . Smoking status: Former Smoker    Years: 20.00    Types: Pipe    Start date: 08/09/1993    Quit date: 08/09/2013  . Smokeless tobacco: Never Used  . Alcohol use 0.6 oz/week    1 Glasses of wine per week  . Drug use: No  . Sexual activity: Yes    Partners: Female   Other Topics Concern  . Not on file   Social History Narrative  . No narrative on file     Current Outpatient Prescriptions:  .  atorvastatin (LIPITOR) 80 MG tablet, Take 1 tablet (80 mg total) by mouth daily at 6 PM., Disp: 90 tablet, Rfl: 1 .  clopidogrel (PLAVIX) 75 MG tablet, Take 1 tablet (75 mg total) by mouth daily., Disp: 90 tablet, Rfl: 1  Allergies  Allergen Reactions  .  Penicillins Hives     ROS  Constitutional: Negative for fever or weight change.  Respiratory: Negative for cough and shortness of breath.   Cardiovascular: Negative for chest pain or palpitations.  Gastrointestinal: Negative for abdominal pain, no bowel changes.  Musculoskeletal: Negative for gait problem or joint swelling.  Skin: Negative for rash.  Neurological: Negative for dizziness or headache.  No other specific complaints in a complete review of systems (except as listed in HPI above).  Objective  Vitals:   05/28/16 1004  BP: 130/80  Pulse: 74  Resp: 16  Temp: 98 F (36.7 C)  TempSrc: Oral  SpO2: 97%  Weight: 165 lb 1.6 oz (74.9 kg)  Height: 5\' 11"  (1.803 m)    Body mass index is 23.03 kg/m.  Physical Exam  Constitutional: Patient appears well-developed  and well-nourished.  No distress.  HEENT: head atraumatic, normocephalic, pupils equal and reactive to light, e neck supple, throat within normal limits Cardiovascular: Normal rate, regular rhythm and normal heart sounds.  No murmur heard. No BLE edema. Pulmonary/Chest: Effort normal and breath sounds normal. No respiratory distress. Abdominal: Soft.  There is no tenderness. Psychiatric: Patient has a normal mood and affect. behavior is normal. Judgment and thought content normal. Skin: senile purpura, and pedunculated lesion on left lower lip - white    PHQ2/9: Depression screen [facility] 2/9 11/28/2015 04/21/2015  Decreased Interest 0 0  Down, Depressed, Hopeless 0 0  PHQ - 2 Score 0 0    Fall Risk: Fall Risk  11/28/2015 04/21/2015  Falls in the past year? No No    Assessment & Plan  1. Pure hypercholesterolemia  - Lipid panel  2. Lip lesion  - Ambulatory referral to Dermatology  3. Hx of transient ischemic attack (TIA)  - atorvastatin (LIPITOR) 80 MG tablet; Take 1 tablet (80 mg total) by mouth daily at 6 PM.  Dispense: 90 tablet; Refill: 1 - clopidogrel (PLAVIX) 75 MG tablet; Take 1 tablet (75 mg total) by mouth daily.  Dispense: 90 tablet; Refill: 1  4. Senile purpura (HCC)   5. H/O malignant neoplasm of skin  - Ambulatory referral to Dermatology  6. History of alcoholism (HCC)  Doing well   7. Long-term use of high-risk medication  - CBC with Differential/Platelet - Comprehensive metabolic panel  8. Colon cancer screening  - Ambulatory referral to Gastroenterology  9. Screening for diabetes mellitus (DM)  - Hemoglobin A1c

## 2016-06-03 DIAGNOSIS — E7800 Pure hypercholesterolemia, unspecified: Secondary | ICD-10-CM | POA: Diagnosis not present

## 2016-06-03 DIAGNOSIS — Z131 Encounter for screening for diabetes mellitus: Secondary | ICD-10-CM | POA: Diagnosis not present

## 2016-06-03 DIAGNOSIS — Z79899 Other long term (current) drug therapy: Secondary | ICD-10-CM | POA: Diagnosis not present

## 2016-06-03 LAB — CBC WITH DIFFERENTIAL/PLATELET
BASOS ABS: 0 {cells}/uL (ref 0–200)
Basophils Relative: 0 %
EOS ABS: 130 {cells}/uL (ref 15–500)
EOS PCT: 2 %
HCT: 44.2 % (ref 38.5–50.0)
HEMOGLOBIN: 14.9 g/dL (ref 13.2–17.1)
LYMPHS ABS: 1885 {cells}/uL (ref 850–3900)
Lymphocytes Relative: 29 %
MCH: 29.3 pg (ref 27.0–33.0)
MCHC: 33.7 g/dL (ref 32.0–36.0)
MCV: 86.8 fL (ref 80.0–100.0)
MPV: 9.3 fL (ref 7.5–12.5)
Monocytes Absolute: 585 cells/uL (ref 200–950)
Monocytes Relative: 9 %
NEUTROS ABS: 3900 {cells}/uL (ref 1500–7800)
Neutrophils Relative %: 60 %
Platelets: 231 10*3/uL (ref 140–400)
RBC: 5.09 MIL/uL (ref 4.20–5.80)
RDW: 13.6 % (ref 11.0–15.0)
WBC: 6.5 10*3/uL (ref 3.8–10.8)

## 2016-06-03 LAB — HEMOGLOBIN A1C
HEMOGLOBIN A1C: 5.2 % (ref <5.7)
MEAN PLASMA GLUCOSE: 103 mg/dL

## 2016-06-03 LAB — LIPID PANEL
CHOLESTEROL: 145 mg/dL (ref 125–200)
HDL: 61 mg/dL (ref >=40)
LDL CALC: 73 mg/dL (ref <130)
TRIGLYCERIDES: 53 mg/dL (ref <150)
Total CHOL/HDL Ratio: 2.4 Ratio (ref <=5.0)
VLDL: 11 mg/dL (ref <30)

## 2016-06-03 LAB — COMPREHENSIVE METABOLIC PANEL
ALBUMIN: 4.1 g/dL (ref 3.6–5.1)
ALK PHOS: 76 U/L (ref 40–115)
ALT: 19 U/L (ref 9–46)
AST: 21 U/L (ref 10–35)
BILIRUBIN TOTAL: 0.6 mg/dL (ref 0.2–1.2)
BUN: 21 mg/dL (ref 7–25)
CHLORIDE: 106 mmol/L (ref 98–110)
CO2: 27 mmol/L (ref 20–31)
CREATININE: 0.87 mg/dL (ref 0.70–1.25)
Calcium: 9.3 mg/dL (ref 8.6–10.3)
Glucose, Bld: 81 mg/dL (ref 65–99)
Potassium: 4.7 mmol/L (ref 3.5–5.3)
SODIUM: 139 mmol/L (ref 135–146)
TOTAL PROTEIN: 7.1 g/dL (ref 6.1–8.1)

## 2016-11-15 ENCOUNTER — Other Ambulatory Visit: Payer: Self-pay | Admitting: Family Medicine

## 2016-11-15 DIAGNOSIS — Z8673 Personal history of transient ischemic attack (TIA), and cerebral infarction without residual deficits: Secondary | ICD-10-CM

## 2016-11-15 NOTE — Telephone Encounter (Signed)
Patient requesting refill of Atorvastatin to [facility].  

## 2016-11-26 ENCOUNTER — Ambulatory Visit (INDEPENDENT_AMBULATORY_CARE_PROVIDER_SITE_OTHER): Payer: BLUE CROSS/BLUE SHIELD | Admitting: Family Medicine

## 2016-11-26 ENCOUNTER — Encounter: Payer: Self-pay | Admitting: Family Medicine

## 2016-11-26 VITALS — BP 140/74 | HR 74 | Temp 98.2°F | Resp 16 | Ht 71.0 in | Wt 168.7 lb

## 2016-11-26 DIAGNOSIS — Z0000 Encounter for general adult medical examination without abnormal findings: Secondary | ICD-10-CM | POA: Diagnosis not present

## 2016-11-26 DIAGNOSIS — I10 Essential (primary) hypertension: Secondary | ICD-10-CM

## 2016-11-26 DIAGNOSIS — E7800 Pure hypercholesterolemia, unspecified: Secondary | ICD-10-CM

## 2016-11-26 DIAGNOSIS — Z8673 Personal history of transient ischemic attack (TIA), and cerebral infarction without residual deficits: Secondary | ICD-10-CM

## 2016-11-26 DIAGNOSIS — F1021 Alcohol dependence, in remission: Secondary | ICD-10-CM

## 2016-11-26 DIAGNOSIS — Z125 Encounter for screening for malignant neoplasm of prostate: Secondary | ICD-10-CM | POA: Diagnosis not present

## 2016-11-26 DIAGNOSIS — Z1211 Encounter for screening for malignant neoplasm of colon: Secondary | ICD-10-CM

## 2016-11-26 DIAGNOSIS — I351 Nonrheumatic aortic (valve) insufficiency: Secondary | ICD-10-CM | POA: Diagnosis not present

## 2016-11-26 DIAGNOSIS — D692 Other nonthrombocytopenic purpura: Secondary | ICD-10-CM

## 2016-11-26 MED ORDER — LISINOPRIL 10 MG PO TABS: 10.0000 mg | ORAL_TABLET | Freq: Every day | ORAL | 1 refills | Status: DC

## 2016-11-26 NOTE — Progress Notes (Signed)
Name: Raymond Chambers   MRN: [id number]    DOB: Nov 29, 1955   Date:11/26/2016       Progress Note  Subjective  Chief Complaint  Chief Complaint  Patient presents with  . Annual Exam    CPE    HPI  Male exam: he has difficulty to start an erection, but able to maintain it once ready. Viagra helped, but doing better, spreading intercourse more and more foreplay has helped. No blood in urine, no change in bowel movements, no chest pain or palpation  Hyperlipidemia: last labs reviewed with patient, no side effects of Atorvastatin  HTN: he has noticed that his bp has been elevated over the past few months in the 140's range, no chest pain or palpitation. He is interested in starting medication.   TIA: he was at work on 09/07 and developed acute onset of dizziness. Followed by left facial tingling, left leg and arm tingling and heaviness, the episode lasted about 2 minutes . He went to [facility] and transferred to [facility], he had multiple tests done, abnormal findings ( stable mild apical pleural thickening and bicuspid Aortic Valve, increase in in subcortical hyper intensities slightly greater than expected for age) and elevated LDL. He has been asymptomatic since discharge from hospital. He has been compliant with Atorvastatin and Plavix. No recent episodes of tingling. He was advised to see cardiologist and neurologist but not has seen either one yet, but willing to be seen now.   History of alcoholism: used to be a heavy drinker, currently only drinks one glass of wine with wife about once a week.   IPSS Questionnaire (AUA-7): Over the past month.   1)  How often have you had a sensation of not emptying your bladder completely after you finish urinating?  0 - Not at all  2)  How often have you had to urinate again less than two hours after you finished urinating? 0 - Not at all  3)  How often have you found you stopped and started again several times when you urinated?  0 - Not at all   4) How difficult have you found it to postpone urination?  1 - Less than 1 time in 5  5) How often have you had a weak urinary stream?  0 - Not at all  6) How often have you had to push or strain to begin urination?  0 - Not at all  7) How many times did you most typically get up to urinate from the time you went to bed until the time you got up in the morning?  1 - 1 time  Total score:  0-7 mildly symptomatic   8-19 moderately symptomatic   20-35 severely symptomatic    Patient Active Problem List   Diagnosis Date Noted  . History of alcoholism (HCC) 04/17/2015  . 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 Acid reflux 04/17/2015  . Herniation of nucleus pulposus 04/17/2015  . Bilateral inguinal hernia 04/17/2015  . H/O malignant neoplasm of skin 04/17/2015  . Bicuspid aortic valve 04/17/2015  . HLD (hyperlipidemia)   . Hx of transient ischemic attack (TIA) 04/16/2015    Past Surgical History:  Procedure Laterality Date  . KNEE SURGERY     2005 left     Family History  Problem Relation Age of Onset  . Cancer Father     Stomach   . Alcohol abuse Father   . Heart disease Father   .  Diabetes Maternal Grandmother     Social History   Social History  . Marital status: Married    Spouse name: N/A  . Number of children: N/A  . Years of education: N/A   Occupational History  . Not on file.   Social History Main Topics  . Smoking status: Former Smoker    Years: 20.00    Types: Pipe    Start date: 08/09/1993    Quit date: 08/09/2013  . Smokeless tobacco: Never Used  . Alcohol use 0.6 oz/week    1 Glasses of wine per week  . Drug use: No  . Sexual activity: Yes    Partners: Female   Other Topics Concern  . Not on file   Social History Narrative  . No narrative on file     Current Outpatient Prescriptions:  .  atorvastatin (LIPITOR) 80 MG tablet, TAKE 1 TABLET EVERY DAY AT 6 PM, Disp: 30 tablet, Rfl: 0 .  clopidogrel (PLAVIX) 75 MG tablet,  Take 1 tablet (75 mg total) by mouth daily., Disp: 90 tablet, Rfl: 1 .  lisinopril (PRINIVIL,ZESTRIL) 10 MG tablet, Take 1 tablet (10 mg total) by mouth daily., Disp: 90 tablet, Rfl: 1  Allergies  Allergen Reactions  . Penicillins Hives     ROS  Constitutional: Negative for fever or weight change.  Respiratory: Negative for cough and shortness of breath.   Cardiovascular: Negative for chest pain or palpitations.  Gastrointestinal: Negative for abdominal pain, no bowel changes.  Musculoskeletal: Negative for gait problem or joint swelling.  Skin: Negative for rash.  Neurological: Negative for dizziness or headache.  No other specific complaints in a complete review of systems (except as listed in HPI above).   Objective  Vitals:   11/26/16 1105  BP: 140/74  Pulse: 74  Resp: 16  Temp: 98.2 F (36.8 C)  TempSrc: Oral  SpO2: 98%  Weight: 168 lb 11.2 oz (76.5 kg)  Height:  (1.803 m)    Body mass index is 23.53 kg/m.  Physical Exam  Constitutional: Patient appears well-developed and well-nourished. No distress.  HENT: Head: Normocephalic and atraumatic. Ears: B TMs ok, no erythema or effusion; Nose: Nose normal. Mouth/Throat: Oropharynx is clear and moist. No oropharyngeal exudate.  Eyes: Conjunctivae and EOM are normal. Pupils are equal, round, and reactive to light. No scleral icterus.  Neck: Normal range of motion. Neck supple. No JVD present. No thyromegaly present.  Cardiovascular: Normal rate, regular rhythm and normal heart sounds.  No murmur heard. No BLE edema. Pulmonary/Chest: Effort normal and breath sounds normal. No respiratory distress. Abdominal: Soft. Bowel sounds are normal, no distension. There is no tenderness. no masses MALE GENITALIA: Normal descended testes bilaterally, no masses palpated, bilateral reducible direct inguinal hernias, left worse than right, no lesions, no discharge  RECTAL: not done Musculoskeletal: Normal range of motion, no  joint effusions. No gross deformities Neurological: he is alert and oriented to person, place, and time. No cranial nerve deficit. Coordination, balance, strength, speech and gait are normal.  Skin: Skin is warm and dry. No rash noted. No erythema.  Psychiatric: Patient has a normal mood and affect. behavior is normal. Judgment and thought content normal.  PHQ2/9: Depression screen [facility] 2/9 11/26/2016 11/28/2015 04/21/2015  Decreased Interest 0 0 0  Down, Depressed, Hopeless 0 0 0  PHQ - 2 Score 0 0 0     Fall Risk: Fall Risk  11/26/2016 11/28/2015 04/21/2015  Falls in the past year? No No No  Functional Status Survey: Is the patient deaf or have difficulty hearing?: No Does the patient have difficulty seeing, even when wearing glasses/contacts?: Yes (glasses) Does the patient have difficulty concentrating, remembering, or making decisions?: No Does the patient have difficulty walking or climbing stairs?: No Does the patient have difficulty dressing or bathing?: No Does the patient have difficulty doing errands alone such as visiting a doctor's office or shopping?: No    Assessment & Plan  1. Encounter for routine history and physical exam for male  Discussed importance of 150 minutes of physical activity weekly, eat two servings of fish weekly, eat one serving of tree nuts ( cashews, pistachios, pecans, almonds.Marland Kitchen) every other day, eat 6 servings of fruit/vegetables daily and drink plenty of water and avoid sweet beverages.   2. Hx of transient ischemic attack (TIA)  Doing well on plavix and statin therapy   3. Pure hypercholesterolemia  On statin therapy   4. Senile purpura (HCC)  Stable, off aspirin  5. History of alcoholism (HCC)  He very seldom drinks wine, one glass at most.   6. Hypertension, benign  Elevated over the past few months, also 140 today, he agrees on starting taking medication, discussed risk of cough and angioedema - lisinopril (PRINIVIL,ZESTRIL)  10 MG tablet; Take 1 tablet (10 mg total) by mouth daily.  Dispense: 90 tablet; Refill: 1  7. Prostate cancer screening  - PSA  8. Mild aortic regurgitation  - Ambulatory referral to Cardiology  9. Colon cancer screening  - Ambulatory referral to Gastroenterology

## 2016-11-27 LAB — PSA: PSA: 1.1 ng/mL (ref <=4.0)

## 2016-12-21 DIAGNOSIS — T23112A Burn of first degree of left thumb (nail), initial encounter: Secondary | ICD-10-CM | POA: Diagnosis not present

## 2016-12-21 DIAGNOSIS — T310 Burns involving less than 10% of body surface: Secondary | ICD-10-CM | POA: Diagnosis not present

## 2016-12-28 ENCOUNTER — Telehealth: Payer: Self-pay | Admitting: Gastroenterology

## 2016-12-28 NOTE — Telephone Encounter (Signed)
colonoscopy

## 2016-12-30 NOTE — Telephone Encounter (Signed)
LVM for patient callback to schedule procedure. 

## 2017-01-10 ENCOUNTER — Other Ambulatory Visit: Payer: Self-pay

## 2017-01-10 ENCOUNTER — Telehealth: Payer: Self-pay | Admitting: Gastroenterology

## 2017-01-10 DIAGNOSIS — Z1211 Encounter for screening for malignant neoplasm of colon: Secondary | ICD-10-CM

## 2017-01-10 NOTE — Telephone Encounter (Signed)
Gastroenterology Pre-Procedure Review  Request Date: 02/18/17 Friday Requesting Physician: Dr. Servando SnareWohl  PATIENT REVIEW QUESTIONS: The patient responded to the following health history questions as indicated:    1. Are you having any GI issues? no 2. Do you have a personal history of Polyps? no 3. Do you have a family history of Colon Cancer or Polyps? no 4. Diabetes Mellitus? no 5. Joint replacements in the past 12 months?no 6. Major health problems in the past 3 months?no 7. Any artificial heart valves, MVP, or defibrillator?no    MEDICATIONS & ALLERGIES:    Patient reports the following regarding taking any anticoagulation/antiplatelet therapy:   Plavix, Coumadin, Eliquis, Xarelto, Lovenox, Pradaxa, Brilinta, or Effient? yes (Plavix 75 mg daily) Aspirin? no  Patient confirms/reports the following medications:  Current Outpatient Prescriptions  Medication Sig Dispense Refill  . atorvastatin (LIPITOR) 80 MG tablet TAKE 1 TABLET EVERY DAY AT 6 PM 30 tablet 0  . clopidogrel (PLAVIX) 75 MG tablet Take 1 tablet (75 mg total) by mouth daily. 90 tablet 1  . lisinopril (PRINIVIL,ZESTRIL) 10 MG tablet Take 1 tablet (10 mg total) by mouth daily. 90 tablet 1   No current facility-administered medications for this visit.     Patient confirms/reports the following allergies:  Allergies  Allergen Reactions  . Penicillins Hives    No orders of the defined types were placed in this encounter.   AUTHORIZATION INFORMATION Primary Insurance: 1D#: Group #:  Secondary Insurance: 1D#: Group #:  SCHEDULE INFORMATION: Date: 02/18/17 Dr. Servando SnareWohl Time: Location:[facility]

## 2017-01-10 NOTE — Telephone Encounter (Signed)
Patient left a voice message that he needs to schedule a colonoscopy °

## 2017-01-27 ENCOUNTER — Other Ambulatory Visit: Payer: Self-pay | Admitting: Family Medicine

## 2017-01-27 DIAGNOSIS — Z8673 Personal history of transient ischemic attack (TIA), and cerebral infarction without residual deficits: Secondary | ICD-10-CM

## 2017-01-27 NOTE — Telephone Encounter (Signed)
Patient requesting refill of Atorvastatin to [facility].  

## 2017-01-31 ENCOUNTER — Telehealth: Payer: Self-pay

## 2017-01-31 ENCOUNTER — Encounter: Admission: RE | Payer: Self-pay | Source: Ambulatory Visit

## 2017-01-31 ENCOUNTER — Ambulatory Visit
Admission: RE | Admit: 2017-01-31 | Payer: BLUE CROSS/BLUE SHIELD | Source: Ambulatory Visit | Admitting: Gastroenterology

## 2017-01-31 SURGERY — COLONOSCOPY WITH PROPOFOL
Anesthesia: Choice

## 2017-01-31 NOTE — Telephone Encounter (Signed)
Patient has been left a voice message to contact office to reschedule 02/18/17 colonoscopy.

## 2017-02-01 ENCOUNTER — Telehealth: Payer: Self-pay

## 2017-02-01 NOTE — Telephone Encounter (Signed)
Pt has been left a message to contact office to reschedule Colonoscopy.  It was originally scheduled for 02/18/17 however Dr. Servando SnareWohl will not be in on that day. Pt is on Plavix and Dr. Carlynn Purlsowles has advised to stop Plavix 3 days prior to procedure and restart blood thinner 1 day after procedure.

## 2017-02-18 ENCOUNTER — Other Ambulatory Visit: Payer: Self-pay

## 2017-02-18 ENCOUNTER — Telehealth: Payer: Self-pay | Admitting: Gastroenterology

## 2017-02-18 DIAGNOSIS — Z1211 Encounter for screening for malignant neoplasm of colon: Secondary | ICD-10-CM

## 2017-02-18 NOTE — Telephone Encounter (Signed)
Please call patients wife, Raymond Chambers, to reschedule a colonoscopy [phone]

## 2017-02-21 NOTE — Telephone Encounter (Signed)
Pt has rescheduled colonoscopy to 03/29/17 with Wohl. Paperwork and rx has been mailed.

## 2017-03-04 ENCOUNTER — Ambulatory Visit: Payer: BLUE CROSS/BLUE SHIELD | Admitting: Internal Medicine

## 2017-03-24 ENCOUNTER — Other Ambulatory Visit: Payer: Self-pay | Admitting: Family Medicine

## 2017-03-24 DIAGNOSIS — Z8673 Personal history of transient ischemic attack (TIA), and cerebral infarction without residual deficits: Secondary | ICD-10-CM

## 2017-03-29 ENCOUNTER — Encounter
Admission: RE | Disposition: A | Discharge status: Home / Self Care | Payer: Self-pay | Source: Ambulatory Visit | Attending: Gastroenterology

## 2017-03-29 ENCOUNTER — Ambulatory Visit
Admission: RE | Admit: 2017-03-29 | Discharge: 2017-03-29 | Disposition: A | Discharge status: Home / Self Care | Payer: BLUE CROSS/BLUE SHIELD | Source: Ambulatory Visit | Attending: Gastroenterology | Admitting: Gastroenterology

## 2017-03-29 ENCOUNTER — Ambulatory Visit: Payer: BLUE CROSS/BLUE SHIELD | Admitting: Anesthesiology

## 2017-03-29 DIAGNOSIS — I10 Essential (primary) hypertension: Secondary | ICD-10-CM | POA: Diagnosis not present

## 2017-03-29 DIAGNOSIS — Z833 Family history of diabetes mellitus: Secondary | ICD-10-CM | POA: Insufficient documentation

## 2017-03-29 DIAGNOSIS — Z811 Family history of alcohol abuse and dependence: Secondary | ICD-10-CM | POA: Diagnosis not present

## 2017-03-29 DIAGNOSIS — E785 Hyperlipidemia, unspecified: Secondary | ICD-10-CM | POA: Insufficient documentation

## 2017-03-29 DIAGNOSIS — F1011 Alcohol abuse, in remission: Secondary | ICD-10-CM | POA: Diagnosis not present

## 2017-03-29 DIAGNOSIS — Z87891 Personal history of nicotine dependence: Secondary | ICD-10-CM | POA: Diagnosis not present

## 2017-03-29 DIAGNOSIS — Z880 Allergy status to penicillin: Secondary | ICD-10-CM | POA: Diagnosis not present

## 2017-03-29 DIAGNOSIS — Z9889 Other specified postprocedural states: Secondary | ICD-10-CM | POA: Insufficient documentation

## 2017-03-29 DIAGNOSIS — K635 Polyp of colon: Secondary | ICD-10-CM | POA: Diagnosis not present

## 2017-03-29 DIAGNOSIS — Z7902 Long term (current) use of antithrombotics/antiplatelets: Secondary | ICD-10-CM | POA: Diagnosis not present

## 2017-03-29 DIAGNOSIS — Z1211 Encounter for screening for malignant neoplasm of colon: Secondary | ICD-10-CM | POA: Diagnosis not present

## 2017-03-29 DIAGNOSIS — K621 Rectal polyp: Secondary | ICD-10-CM

## 2017-03-29 DIAGNOSIS — Z8673 Personal history of transient ischemic attack (TIA), and cerebral infarction without residual deficits: Secondary | ICD-10-CM | POA: Diagnosis not present

## 2017-03-29 DIAGNOSIS — F329 Major depressive disorder, single episode, unspecified: Secondary | ICD-10-CM | POA: Insufficient documentation

## 2017-03-29 DIAGNOSIS — Z85828 Personal history of other malignant neoplasm of skin: Secondary | ICD-10-CM | POA: Diagnosis not present

## 2017-03-29 DIAGNOSIS — Z800 Family history of malignant neoplasm of digestive organs: Secondary | ICD-10-CM | POA: Diagnosis not present

## 2017-03-29 DIAGNOSIS — D128 Benign neoplasm of rectum: Secondary | ICD-10-CM | POA: Diagnosis not present

## 2017-03-29 DIAGNOSIS — Z8249 Family history of ischemic heart disease and other diseases of the circulatory system: Secondary | ICD-10-CM | POA: Insufficient documentation

## 2017-03-29 DIAGNOSIS — Z79899 Other long term (current) drug therapy: Secondary | ICD-10-CM | POA: Insufficient documentation

## 2017-03-29 HISTORY — PX: COLONOSCOPY WITH PROPOFOL: SHX5780

## 2017-03-29 HISTORY — DX: Cerebral infarction, unspecified: I63.9

## 2017-03-29 SURGERY — COLONOSCOPY WITH PROPOFOL
Anesthesia: General

## 2017-03-29 MED ORDER — SODIUM CHLORIDE 0.9 % IV SOLN
INTRAVENOUS | Status: DC
  Administered 2017-03-29: 11:00:00 via INTRAVENOUS

## 2017-03-29 MED ORDER — PROPOFOL 500 MG/50ML IV EMUL
INTRAVENOUS | Status: DC | PRN
  Administered 2017-03-29: 200 ug/kg/min via INTRAVENOUS

## 2017-03-29 MED ORDER — LIDOCAINE 2% (20 MG/ML) 5 ML SYRINGE
INTRAMUSCULAR | Status: DC | PRN
  Administered 2017-03-29: 50 mg via INTRAVENOUS

## 2017-03-29 MED ORDER — PROPOFOL 10 MG/ML IV BOLUS
INTRAVENOUS | Status: DC | PRN
  Administered 2017-03-29: 70 mg via INTRAVENOUS

## 2017-03-29 MED ORDER — PROPOFOL 500 MG/50ML IV EMUL
INTRAVENOUS | Status: AC
  Filled 2017-03-29: qty 50

## 2017-03-29 NOTE — Op Note (Signed)
[facility] Patient Name: Raymond Chambers Procedure Date: 03/29/2017 11:40 AM MRN: [id number] Account #: [id number] Date of Birth: [birth year] Admit Type: Outpatient Age: 61 Room: [facility] ENDO ROOM 4 Gender: Male Note Status: Finalized Procedure:            Colonoscopy Indications:          Screening for colorectal malignant neoplasm Providers:            Midge Minium MD, MD Referring MD:         Onnie Boer. Sowles, MD (Referring MD) Medicines:            Propofol per Anesthesia Complications:        No immediate complications. Procedure:            Pre-Anesthesia Assessment:                       - Prior to the procedure, a History and Physical was                        performed, and patient medications and allergies were                        reviewed. The patient's tolerance of previous                        anesthesia was also reviewed. The risks and benefits of                        the procedure and the sedation options and risks were                        discussed with the patient. All questions were                        answered, and informed consent was obtained. Prior                        Anticoagulants: The patient has taken no previous                        anticoagulant or antiplatelet agents. ASA Grade                        Assessment: II - A patient with mild systemic disease.                        After reviewing the risks and benefits, the patient was                        deemed in satisfactory condition to undergo the                        procedure.                       After obtaining informed consent, the colonoscope was                        passed under direct vision. Throughout the procedure,  the patient's blood pressure, pulse, and oxygen                        saturations were monitored continuously. The Olympus                        CF-H180AL colonoscope ( S#: [id number] ) was introduced                        through the anus and advanced to the the cecum,                        identified by appendiceal orifice and ileocecal valve.                        The colonoscopy was performed without difficulty. The                        patient tolerated the procedure well. The quality of                        the bowel preparation was excellent. Findings:      The perianal and digital rectal examinations were normal.      A 7 mm polyp was found in the rectum. The polyp was pedunculated. The       polyp was removed with a hot snare. Resection and retrieval were       complete. Impression:           - One 7 mm polyp in the rectum, removed with a hot                        snare. Resected and retrieved. Recommendation:       - Discharge patient to home.                       - Resume previous diet.                       - Continue present medications.                       - Await pathology results.                       - Repeat colonoscopy in 5 years if polyp adenoma and 10                        years if hyperplastic Procedure Code(s):    --- Professional ---                       [phone], Colonoscopy, flexible; with removal of tumor(s),                        polyp(s), or other lesion(s) by snare technique Diagnosis Code(s):    --- Professional ---                       Z12.11, Encounter for screening for malignant neoplasm  of colon                       K62.1, Rectal polyp CPT copyright 2016 American Medical Association. All rights reserved. The codes documented in this report are preliminary and upon coder review may  be revised to meet current compliance requirements. Midge Minium MD, MD 03/29/2017 12:04:41 PM This report has been signed electronically. Number of Addenda: 0 Note Initiated On: 03/29/2017 11:40 AM Scope Withdrawal Time: 0 hours 9 minutes 30 seconds  Total Procedure Duration: 0 hours 14 minutes 6 seconds       [facility]

## 2017-03-29 NOTE — Anesthesia Postprocedure Evaluation (Signed)
Anesthesia Post Note  Patient: Raymond Chambers  Procedure(s) Performed: Procedure(s) (LRB): COLONOSCOPY WITH PROPOFOL (N/A)  Patient location during evaluation: Endoscopy Anesthesia Type: General Level of consciousness: awake and alert Pain management: pain level controlled Vital Signs Assessment: post-procedure vital signs reviewed and stable Respiratory status: spontaneous breathing, nonlabored ventilation, respiratory function stable and patient connected to nasal cannula oxygen Cardiovascular status: blood pressure returned to baseline and stable Postop Assessment: no signs of nausea or vomiting Anesthetic complications: no     Last Vitals:  Vitals:   03/29/17 1208 03/29/17 1209  BP: 100/64 100/64  Pulse:  64  Resp:  15  Temp: 36.7 C   SpO2:  100%    Last Pain:  Vitals:   03/29/17 1208  TempSrc: Tympanic                 Lenard Simmer

## 2017-03-29 NOTE — H&P (Signed)
   Midge Minium, MD [facility] [address] [place], [place] [address] Phone: [phone] Fax : [phone]  Primary Care Physician:  Alba Cory, MD Primary Gastroenterologist:  Dr. Servando Snare  Pre-Procedure History & Physical: HPI:  Raymond Chambers is a 61 y.o. male is here for a screening colonoscopy.   Past Medical History:  Diagnosis Date  . Abnormal CXR (chest x-ray)    Repeat CXR in 6 months  . Alcohol abuse, in remission   . Bilateral inguinal hernia   . Cyst of finger   . Depression   . GERD (gastroesophageal reflux disease)   . Herniated lumbar intervertebral disc    L-5  . History of attention deficit disorder   . History of skin cancer   . Hyperlipidemia   . Stroke [facility])    TIA     Past Surgical History:  Procedure Laterality Date  . KNEE SURGERY     2005 left     Prior to Admission medications   Medication Sig Start Date End Date Taking? Authorizing Provider  atorvastatin (LIPITOR) 80 MG tablet TAKE 1 TABLET BY MOUTH EVERY DAY AT 6 PM 03/24/17   Alba Cory, MD  clopidogrel (PLAVIX) 75 MG tablet Take 1 tablet (75 mg total) by mouth daily. 05/28/16   Alba Cory, MD  lisinopril (PRINIVIL,ZESTRIL) 10 MG tablet Take 1 tablet (10 mg total) by mouth daily. 11/26/16   Alba Cory, MD    Allergies as of 02/18/2017 - Review Complete 11/26/2016  Allergen Reaction Noted  . Penicillins Hives 04/16/2015    Family History  Problem Relation Age of Onset  . Cancer Father        Stomach   . Alcohol abuse Father   . Heart disease Father   . Diabetes Maternal Grandmother     Social History   Social History  . Marital status: Married    Spouse name: N/A  . Number of children: N/A  . Years of education: N/A   Occupational History  . Not on file.   Social History Main Topics  . Smoking status: Former Smoker    Years: 20.00    Types: Pipe    Start date: 08/09/1993    Quit date: 08/09/2013  . Smokeless tobacco: Never Used  . Alcohol use 0.6  oz/week    1 Glasses of wine per week  . Drug use: No  . Sexual activity: Yes    Partners: Female   Other Topics Concern  . Not on file   Social History Narrative  . No narrative on file    Review of Systems: See HPI, otherwise negative ROS  Physical Exam: BP (!) 130/92   Pulse 79   Temp (!) 96.1 F (35.6 C) (Tympanic)   Resp 18   Ht 6' (1.829 m)   Wt 165 lb (74.8 kg)   SpO2 100%   BMI 22.38 kg/m  General:   Alert,  pleasant and cooperative in NAD Head:  Normocephalic and atraumatic. Neck:  Supple; no masses or thyromegaly. Lungs:  Clear throughout to auscultation.    Heart:  Regular rate and rhythm. Abdomen:  Soft, nontender and nondistended. Normal bowel sounds, without guarding, and without rebound.   Neurologic:  Alert and  oriented x4;  grossly normal neurologically.  Impression/Plan: Raymond Chambers is now here to undergo a screening colonoscopy.  Risks, benefits, and alternatives regarding colonoscopy have been reviewed with the patient.  Questions have been answered.  All parties agreeable.

## 2017-03-29 NOTE — Anesthesia Post-op Follow-up Note (Signed)
Anesthesia QCDR form completed.        

## 2017-03-29 NOTE — Anesthesia Preprocedure Evaluation (Signed)
Anesthesia Evaluation  Patient identified by MRN, date of birth, ID band Patient awake    Reviewed: Allergy & Precautions, NPO status , Patient's Chart, lab work & pertinent test results  History of Anesthesia Complications Negative for: history of anesthetic complications  Airway Mallampati: III       Dental   Pulmonary former smoker,           Cardiovascular hypertension, Pt. on medications      Neuro/Psych Depression TIA (l sided weakness)   GI/Hepatic Neg liver ROS, GERD (remote hx)  ,  Endo/Other  neg diabetes  Renal/GU negative Renal ROS     Musculoskeletal   Abdominal   Peds  Hematology   Anesthesia Other Findings   Reproductive/Obstetrics                            Anesthesia Physical Anesthesia Plan  ASA: III  Anesthesia Plan: General   Post-op Pain Management:    Induction: Intravenous  PONV Risk Score and Plan:   Airway Management Planned:   Additional Equipment:   Intra-op Plan:   Post-operative Plan:   Informed Consent: I have reviewed the patients History and Physical, chart, labs and discussed the procedure including the risks, benefits and alternatives for the proposed anesthesia with the patient or authorized representative who has indicated his/her understanding and acceptance.     Plan Discussed with:   Anesthesia Plan Comments:         Anesthesia Quick Evaluation

## 2017-03-29 NOTE — Transfer of Care (Signed)
Immediate Anesthesia Transfer of Care Note  Patient: Raymond Chambers  Procedure(s) Performed: Procedure(s): COLONOSCOPY WITH PROPOFOL (N/A)  Patient Location: Endoscopy Unit  Anesthesia Type:General  Level of Consciousness: awake and alert   Airway & Oxygen Therapy: Patient Spontanous Breathing and Patient connected to nasal cannula oxygen  Post-op Assessment: Report given to RN and Post -op Vital signs reviewed and stable  Post vital signs: stable  Last Vitals:  Vitals:   03/29/17 1208 03/29/17 1209  BP:  100/64  Pulse:  64  Resp:  15  Temp: 36.7 C   SpO2:  100%    Last Pain:  Vitals:   03/29/17 1208  TempSrc: Tympanic         Complications: No apparent anesthesia complications

## 2017-03-30 LAB — SURGICAL PATHOLOGY

## 2017-03-31 ENCOUNTER — Encounter: Payer: Self-pay | Admitting: Gastroenterology

## 2017-04-05 ENCOUNTER — Encounter: Payer: Self-pay | Admitting: Gastroenterology

## 2017-04-08 ENCOUNTER — Ambulatory Visit: Payer: BLUE CROSS/BLUE SHIELD | Admitting: Internal Medicine

## 2017-05-27 ENCOUNTER — Ambulatory Visit: Payer: BLUE CROSS/BLUE SHIELD | Admitting: Family Medicine

## 2017-06-24 ENCOUNTER — Other Ambulatory Visit: Payer: Self-pay | Admitting: Family Medicine

## 2017-06-24 DIAGNOSIS — Z8673 Personal history of transient ischemic attack (TIA), and cerebral infarction without residual deficits: Secondary | ICD-10-CM

## 2017-06-24 NOTE — Telephone Encounter (Signed)
Needs follow-up

## 2017-06-27 NOTE — Telephone Encounter (Signed)
This one says that she has not signed the order. Can not close pt notified

## 2017-06-27 NOTE — Telephone Encounter (Signed)
I will send it once he makes appointment

## 2017-06-28 NOTE — Telephone Encounter (Signed)
CALLED [facility] MESSAGE TO SCHEDULE APPT BEFORE DR CAN REFILL PLAVIX.  TIFFANY IT WILL NOT LET ME CLOSE FOR MEDICATION IS PENDING COULD YOU PLEASE CLOSE

## 2017-07-09 ENCOUNTER — Other Ambulatory Visit: Payer: Self-pay | Admitting: Family Medicine

## 2017-07-09 DIAGNOSIS — Z8673 Personal history of transient ischemic attack (TIA), and cerebral infarction without residual deficits: Secondary | ICD-10-CM

## 2017-07-22 ENCOUNTER — Other Ambulatory Visit: Payer: Self-pay | Admitting: Family Medicine

## 2017-07-22 DIAGNOSIS — Z8673 Personal history of transient ischemic attack (TIA), and cerebral infarction without residual deficits: Secondary | ICD-10-CM

## 2017-07-22 NOTE — Telephone Encounter (Signed)
Refill Request for Cholesterol medication. Atorvastatin 80 mg  Last physical: None indicated  Follow up visit: 08/19/2017   Lab Results  Component Value Date   CHOL 145 06/03/2016   HDL 61 06/03/2016   LDLCALC 73 06/03/2016   TRIG 53 06/03/2016   CHOLHDL 2.4 06/03/2016

## 2017-07-23 NOTE — Telephone Encounter (Signed)
Refill Request for Cholesterol medication. Lipitor 80 mg  Last physical: 11/26/2016  Lab Results  Component Value Date   CHOL 145 06/03/2016   HDL 61 06/03/2016   LDLCALC 73 06/03/2016   TRIG 53 06/03/2016   CHOLHDL 2.4 06/03/2016    Follow up visit: 08/19/2017

## 2017-08-19 ENCOUNTER — Ambulatory Visit: Payer: BLUE CROSS/BLUE SHIELD | Admitting: Family Medicine

## 2017-09-23 ENCOUNTER — Ambulatory Visit: Payer: BLUE CROSS/BLUE SHIELD | Admitting: Family Medicine

## 2017-09-23 ENCOUNTER — Encounter: Payer: Self-pay | Admitting: Family Medicine

## 2017-09-23 VITALS — BP 108/68 | HR 77 | Temp 97.9°F | Resp 16 | Ht 71.0 in | Wt 162.3 lb

## 2017-09-23 DIAGNOSIS — D692 Other nonthrombocytopenic purpura: Secondary | ICD-10-CM

## 2017-09-23 DIAGNOSIS — Z8673 Personal history of transient ischemic attack (TIA), and cerebral infarction without residual deficits: Secondary | ICD-10-CM | POA: Diagnosis not present

## 2017-09-23 DIAGNOSIS — E7800 Pure hypercholesterolemia, unspecified: Secondary | ICD-10-CM

## 2017-09-23 DIAGNOSIS — Z131 Encounter for screening for diabetes mellitus: Secondary | ICD-10-CM

## 2017-09-23 DIAGNOSIS — N528 Other male erectile dysfunction: Secondary | ICD-10-CM

## 2017-09-23 DIAGNOSIS — I351 Nonrheumatic aortic (valve) insufficiency: Secondary | ICD-10-CM

## 2017-09-23 DIAGNOSIS — F1021 Alcohol dependence, in remission: Secondary | ICD-10-CM

## 2017-09-23 DIAGNOSIS — I10 Essential (primary) hypertension: Secondary | ICD-10-CM | POA: Diagnosis not present

## 2017-09-23 MED ORDER — ATORVASTATIN CALCIUM 80 MG PO TABS: 80.0000 mg | ORAL_TABLET | Freq: Every day | ORAL | 1 refills | Status: DC

## 2017-09-23 MED ORDER — TADALAFIL 10 MG PO TABS: 10.0000 mg | ORAL_TABLET | ORAL | 2 refills | Status: DC | PRN

## 2017-09-23 MED ORDER — CLOPIDOGREL BISULFATE 75 MG PO TABS: 75.0000 mg | ORAL_TABLET | Freq: Every day | ORAL | 0 refills | Status: DC

## 2017-09-23 MED ORDER — LISINOPRIL 10 MG PO TABS: 10.0000 mg | ORAL_TABLET | Freq: Every day | ORAL | 1 refills | Status: DC

## 2017-09-23 NOTE — Patient Instructions (Signed)
Leg Cramps Leg cramps occur when a muscle or muscles tighten and you have no control over this tightening (involuntary muscle contraction). Muscle cramps can develop in any muscle, but the most common place is in the calf muscles of the leg. Those cramps can occur during exercise or when you are at rest. Leg cramps are painful, and they may last for a few seconds to a few minutes. Cramps may return several times before they finally stop. Usually, leg cramps are not caused by a serious medical problem. In many cases, the cause is not known. Some common causes include:  Overexertion.  Overuse from repetitive motions, or doing the same thing over and over.  Remaining in a certain position for a long period of time.  Improper preparation, form, or technique while performing a sport or an activity.  Dehydration.  Injury.  Side effects of some medicines.  Abnormally low levels of the salts and ions in your blood (electrolytes), especially potassium and calcium. These levels could be low if you are taking water pills (diuretics) or if you are pregnant.  Follow these instructions at home: Watch your condition for any changes. Taking the following actions may help to lessen any discomfort that you are feeling:  Stay well-hydrated. Drink enough fluid to keep your urine clear or pale yellow.  Try massaging, stretching, and relaxing the affected muscle. Do this for several minutes at a time.  For tight or tense muscles, use a warm towel, heating pad, or hot shower water directed to the affected area.  If you are sore or have pain after a cramp, applying ice to the affected area may relieve discomfort. ? Put ice in a plastic bag. ? Place a towel between your skin and the bag. ? Leave the ice on for 20 minutes, 2-3 times per day.  Avoid strenuous exercise for several days if you have been having frequent leg cramps.  Make sure that your diet includes the essential minerals for your muscles to  work normally.  Take medicines only as directed by your health care provider.  Contact a health care provider if:  Your leg cramps get more severe or more frequent, or they do not improve over time.  Your foot becomes cold, numb, or blue. This information is not intended to replace advice given to you by your health care provider. Make sure you discuss any questions you have with your health care provider. Document Released: 09/02/2004 Document Revised: 01/01/2016 Document Reviewed: 07/03/2014 Elsevier Interactive Patient Education  2018 Elsevier Inc.  

## 2017-09-23 NOTE — Progress Notes (Signed)
Name: Raymond Chambers   MRN: [id number]    DOB: [birth year]   Date:09/23/2017       Progress Note  Subjective  Chief Complaint  Chief Complaint  Patient presents with  . Medication Refill    6 month F/U  . Transient Ischemic Attack  . Erectile Dysfunction    Would like to discuss Cialis-instead of Viagra  . Hypertension    Denies any symptoms-BP has been running fairly normal at home to low  . Hyperlipidemia    Leg cramps occasionally    HPI  Hyperlipidemia: last labs reviewed with patient, no side effects of Atorvastatin. He has occasional calf cramps, usually at night. Discussed hydration and stretching before bed.   HTN: his bp has been under control again, last visit it was in the 140's range, no chest pain or palpitation. He is happy with medication  TIA: he was at work on 04/2015 and developed acute onset of dizziness. Followed by left facial tingling, left leg and arm tingling and heaviness, the episode lasted about 2 minutes . He went to [facility] and transferred to [facility], he had multiple tests done, abnormal findings ( stable mild apical pleural thickening and bicuspid Aortic Valve, increase in in subcortical hyper intensities slightly greater than expected for age) and elevated LDL. He has been asymptomatic since discharge from hospital. He has been compliant with Atorvastatin and Plavix. No recent episodes of tingling. He was advised to see cardiologist and neurologist but not has seen either one yet. Advised him again to follow up, he wants to call back with the name of the providers that he would like to see. The same one that his wife currently sees.   History of alcoholism: used to be a heavy drinker, currently only drinks one glass of wine with wife about once a week.   ED: he is able to have an erection but unable to maintain, he uses Viagra but wife worries about side effects and he would like to try Cialis. Symptoms going on for the past few years and stable.    Patient Active Problem List   Diagnosis Date Noted  . Colon cancer screening   . Rectal polyp   . Mild aortic regurgitation 11/26/2016  . History of alcoholism (HCC) 04/17/2015  . 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 Acid reflux 04/17/2015  . Herniation of nucleus pulposus 04/17/2015  . Bilateral inguinal hernia 04/17/2015  . H/O malignant neoplasm of skin 04/17/2015  . Bicuspid aortic valve 04/17/2015  . HLD (hyperlipidemia)   . Hx of transient ischemic attack (TIA) 04/16/2015    Past Surgical History:  Procedure Laterality Date  . COLONOSCOPY WITH PROPOFOL N/A 03/29/2017   Procedure: COLONOSCOPY WITH PROPOFOL;  Surgeon: Midge Minium, MD;  Location: [facility] ENDOSCOPY;  Service: Endoscopy;  Laterality: N/A;  . KNEE SURGERY     2005 left     Family History  Problem Relation Age of Onset  . Cancer Father        Stomach   . Heart disease Father   . Diabetes Maternal Grandfather   . Alcohol abuse Paternal Uncle     Social History   Socioeconomic History  . Marital status: Married    Spouse name: Not on file  . Number of children: Not on file  . Years of education: Not on file  . Highest education level: Not on file  Social Needs  . Financial resource strain: Not on file  . Food insecurity -  worry: Not on file  . Food insecurity - inability: Not on file  . Transportation needs - medical: Not on file  . Transportation needs - non-medical: Not on file  Occupational History  . Not on file  Tobacco Use  . Smoking status: Former Smoker    Years: 10.00    Types: Pipe    Start date: 08/10/2003    Last attempt to quit: 08/09/2013    Years since quitting: 4.1  . Smokeless tobacco: Never Used  . Tobacco comment: occasionally smoked   Substance and Sexual Activity  . Alcohol use: Yes    Alcohol/week: 0.6 oz    Types: 1 Glasses of wine per week  . Drug use: No  . Sexual activity: Yes    Partners: Female  Other Topics Concern  . Not on file   Social History Narrative  . Not on file     Current Outpatient Medications:  .  atorvastatin (LIPITOR) 80 MG tablet, Take 1 tablet (80 mg total) by mouth daily at 6 PM., Disp: 90 tablet, Rfl: 1 .  clopidogrel (PLAVIX) 75 MG tablet, Take 1 tablet (75 mg total) by mouth daily., Disp: 90 tablet, Rfl: 0 .  lisinopril (PRINIVIL,ZESTRIL) 10 MG tablet, Take 1 tablet (10 mg total) by mouth daily., Disp: 90 tablet, Rfl: 1 .  tadalafil (CIALIS) 10 MG tablet, Take 1-2 tablets (10-20 mg total) by mouth every other day as needed for erectile dysfunction., Disp: 10 tablet, Rfl: 2  Allergies  Allergen Reactions  . Penicillins Hives     ROS  Constitutional: Negative for fever , positive for mild  weight change.  Respiratory: Negative for cough and shortness of breath.   Cardiovascular: Negative for chest pain or palpitations.  Gastrointestinal: Negative for abdominal pain, no bowel changes.  Musculoskeletal: Negative for gait problem or joint swelling.  Skin: Negative for rash.  Neurological: Negative for dizziness or headache.  No other specific complaints in a complete review of systems (except as listed in HPI above).  Objective  Vitals:   09/23/17 1319  BP: 108/68  Pulse: 77  Resp: 16  Temp: 97.9 F (36.6 C)  TempSrc: Oral  SpO2: 95%  Weight: 162 lb 4.8 oz (73.6 kg)  Height: 5\' 11"  (1.803 m)    Body mass index is 22.64 kg/m.  Physical Exam  Constitutional: Patient appears well-developed and well-nourished. No distress.  HEENT: head atraumatic, normocephalic, pupils equal and reactive to light,  neck supple, throat within normal limits Cardiovascular: Normal rate, regular rhythm and normal heart sounds.  No murmur heard. No BLE edema. Pulmonary/Chest: Effort normal and breath sounds normal. No respiratory distress. Abdominal: Soft.  There is no tenderness. Psychiatric: Patient has a normal mood and affect. behavior is normal. Judgment and thought content  normal.  PHQ2/9: Depression screen [facility] 2/9 09/23/2017 11/26/2016 11/28/2015 04/21/2015  Decreased Interest 0 0 0 0  Down, Depressed, Hopeless 0 0 0 0  PHQ - 2 Score 0 0 0 0     Fall Risk: Fall Risk  09/23/2017 11/26/2016 11/28/2015 04/21/2015  Falls in the past year? No No No No     Functional Status Survey: Is the patient deaf or have difficulty hearing?: No Does the patient have difficulty seeing, even when wearing glasses/contacts?: No Does the patient have difficulty concentrating, remembering, or making decisions?: No Does the patient have difficulty walking or climbing stairs?: No Does the patient have difficulty dressing or bathing?: No Does the patient have difficulty doing errands alone such  as visiting a doctor's office or shopping?: No   Assessment & Plan  1. Pure hypercholesterolemia  - Lipid panel  2. Senile purpura (HCC)  stable  3. History of alcoholism (HCC)  He states he occasionally has a glass of wine, but very seldom  4. Hypertension, benign  - lisinopril (PRINIVIL,ZESTRIL) 10 MG tablet; Take 1 tablet (10 mg total) by mouth daily.  Dispense: 90 tablet; Refill: 1 - CBC with Differential/Platelet - Comprehensive metabolic panel  5. Mild aortic regurgitation  Stable, no syncope  6. Hx of transient ischemic attack (TIA)  - atorvastatin (LIPITOR) 80 MG tablet; Take 1 tablet (80 mg total) by mouth daily at 6 PM.  Dispense: 90 tablet; Refill: 1 - clopidogrel (PLAVIX) 75 MG tablet; Take 1 tablet (75 mg total) by mouth daily.  Dispense: 90 tablet; Refill: 0  7. Diabetes mellitus screening  - Hemoglobin A1c  8. Other male erectile dysfunction  - tadalafil (CIALIS) 10 MG tablet; Take 1-2 tablets (10-20 mg total) by mouth every other day as needed for erectile dysfunction.  Dispense: 10 tablet; Refill: 2

## 2017-09-24 LAB — CBC WITH DIFFERENTIAL/PLATELET
BASOS ABS: 32 {cells}/uL (ref 0–200)
BASOS PCT: 0.6 %
EOS ABS: 103 {cells}/uL (ref 15–500)
EOS PCT: 1.9 %
HEMATOCRIT: 41.6 % (ref 38.5–50.0)
Hemoglobin: 14.5 g/dL (ref 13.2–17.1)
Lymphs Abs: 1598 cells/uL (ref 850–3900)
MCH: 29.5 pg (ref 27.0–33.0)
MCHC: 34.9 g/dL (ref 32.0–36.0)
MCV: 84.7 fL (ref 80.0–100.0)
MPV: 9.9 fL (ref 7.5–12.5)
Monocytes Relative: 9.5 %
NEUTROS ABS: 3154 {cells}/uL (ref 1500–7800)
Neutrophils Relative %: 58.4 %
Platelets: 236 10*3/uL (ref 140–400)
RBC: 4.91 10*6/uL (ref 4.20–5.80)
RDW: 12.6 % (ref 11.0–15.0)
Total Lymphocyte: 29.6 %
WBC mixed population: 513 cells/uL (ref 200–950)
WBC: 5.4 10*3/uL (ref 3.8–10.8)

## 2017-09-24 LAB — COMPREHENSIVE METABOLIC PANEL
AG Ratio: 1.8 (calc) (ref 1.0–2.5)
ALT: 18 U/L (ref 9–46)
AST: 21 U/L (ref 10–35)
Albumin: 4.2 g/dL (ref 3.6–5.1)
Alkaline phosphatase (APISO): 69 U/L (ref 40–115)
BILIRUBIN TOTAL: 0.7 mg/dL (ref 0.2–1.2)
BUN: 16 mg/dL (ref 7–25)
CALCIUM: 9.2 mg/dL (ref 8.6–10.3)
CHLORIDE: 104 mmol/L (ref 98–110)
CO2: 26 mmol/L (ref 20–32)
Creat: 0.92 mg/dL (ref 0.70–1.25)
GLOBULIN: 2.3 g/dL (ref 1.9–3.7)
Glucose, Bld: 78 mg/dL (ref 65–99)
POTASSIUM: 4.2 mmol/L (ref 3.5–5.3)
SODIUM: 138 mmol/L (ref 135–146)
TOTAL PROTEIN: 6.5 g/dL (ref 6.1–8.1)

## 2017-09-24 LAB — HEMOGLOBIN A1C
Hgb A1c MFr Bld: 5.3 % of total Hgb (ref <5.7)
MEAN PLASMA GLUCOSE: 105 (calc)
eAG (mmol/L): 5.8 (calc)

## 2017-09-24 LAB — LIPID PANEL
Cholesterol: 174 mg/dL (ref <200)
HDL: 60 mg/dL (ref >40)
LDL Cholesterol (Calc): 98 mg/dL (calc)
NON-HDL CHOLESTEROL (CALC): 114 mg/dL (ref <130)
TRIGLYCERIDES: 75 mg/dL (ref <150)
Total CHOL/HDL Ratio: 2.9 (calc) (ref <5.0)

## 2017-09-27 ENCOUNTER — Telehealth: Payer: Self-pay

## 2017-09-27 NOTE — Telephone Encounter (Signed)
We called to inform patient his insurance will only cover 4 tablets per month of Cialis. They will not approve the 10 tablets we originally prescribed would he like a new prescription with the 4 tablets or go back on the previous prescription?

## 2018-03-24 ENCOUNTER — Encounter: Payer: Self-pay | Admitting: Family Medicine

## 2018-03-24 ENCOUNTER — Ambulatory Visit (INDEPENDENT_AMBULATORY_CARE_PROVIDER_SITE_OTHER): Payer: BLUE CROSS/BLUE SHIELD | Admitting: Family Medicine

## 2018-03-24 VITALS — BP 136/84 | HR 77 | Temp 98.0°F | Resp 14 | Ht 69.29 in | Wt 156.2 lb

## 2018-03-24 DIAGNOSIS — K4020 Bilateral inguinal hernia, without obstruction or gangrene, not specified as recurrent: Secondary | ICD-10-CM

## 2018-03-24 DIAGNOSIS — Z0000 Encounter for general adult medical examination without abnormal findings: Secondary | ICD-10-CM

## 2018-03-24 DIAGNOSIS — R634 Abnormal weight loss: Secondary | ICD-10-CM | POA: Diagnosis not present

## 2018-03-24 DIAGNOSIS — Z85828 Personal history of other malignant neoplasm of skin: Secondary | ICD-10-CM

## 2018-03-24 DIAGNOSIS — Z125 Encounter for screening for malignant neoplasm of prostate: Secondary | ICD-10-CM | POA: Diagnosis not present

## 2018-03-24 DIAGNOSIS — Z23 Encounter for immunization: Secondary | ICD-10-CM | POA: Diagnosis not present

## 2018-03-24 DIAGNOSIS — R351 Nocturia: Secondary | ICD-10-CM

## 2018-03-24 DIAGNOSIS — N401 Enlarged prostate with lower urinary tract symptoms: Secondary | ICD-10-CM

## 2018-03-24 LAB — COMPLETE METABOLIC PANEL WITH GFR
AG Ratio: 1.8 (calc) (ref 1.0–2.5)
ALBUMIN MSPROF: 4.2 g/dL (ref 3.6–5.1)
ALKALINE PHOSPHATASE (APISO): 69 U/L (ref 40–115)
ALT: 15 U/L (ref 9–46)
AST: 17 U/L (ref 10–35)
BILIRUBIN TOTAL: 0.5 mg/dL (ref 0.2–1.2)
BUN: 20 mg/dL (ref 7–25)
CO2: 25 mmol/L (ref 20–32)
Calcium: 9.1 mg/dL (ref 8.6–10.3)
Chloride: 105 mmol/L (ref 98–110)
Creat: 0.81 mg/dL (ref 0.70–1.25)
GFR, Est African American: 110 mL/min/{1.73_m2} (ref >=60)
GFR, Est Non African American: 95 mL/min/{1.73_m2} (ref >=60)
Globulin: 2.3 g/dL (calc) (ref 1.9–3.7)
Glucose, Bld: 80 mg/dL (ref 65–99)
Potassium: 4.2 mmol/L (ref 3.5–5.3)
SODIUM: 137 mmol/L (ref 135–146)
Total Protein: 6.5 g/dL (ref 6.1–8.1)

## 2018-03-24 LAB — CBC WITH DIFFERENTIAL/PLATELET
Basophils Absolute: 40 cells/uL (ref 0–200)
Basophils Relative: 0.6 %
Eosinophils Absolute: 101 cells/uL (ref 15–500)
Eosinophils Relative: 1.5 %
HCT: 42.8 % (ref 38.5–50.0)
Hemoglobin: 14.5 g/dL (ref 13.2–17.1)
Lymphs Abs: 1487 cells/uL (ref 850–3900)
MCH: 28.9 pg (ref 27.0–33.0)
MCHC: 33.9 g/dL (ref 32.0–36.0)
MCV: 85.4 fL (ref 80.0–100.0)
MPV: 9.8 fL (ref 7.5–12.5)
Monocytes Relative: 9.7 %
NEUTROS PCT: 66 %
Neutro Abs: 4422 cells/uL (ref 1500–7800)
PLATELETS: 250 10*3/uL (ref 140–400)
RBC: 5.01 10*6/uL (ref 4.20–5.80)
RDW: 13.1 % (ref 11.0–15.0)
TOTAL LYMPHOCYTE: 22.2 %
WBC: 6.7 10*3/uL (ref 3.8–10.8)
WBCMIX: 650 {cells}/uL (ref 200–950)

## 2018-03-24 LAB — LIPID PANEL
Cholesterol: 175 mg/dL (ref <200)
HDL: 60 mg/dL (ref >40)
LDL CHOLESTEROL (CALC): 98 mg/dL
Non-HDL Cholesterol (Calc): 115 mg/dL (calc) (ref <130)
Total CHOL/HDL Ratio: 2.9 (calc) (ref <5.0)
Triglycerides: 83 mg/dL (ref <150)

## 2018-03-24 LAB — PSA: PSA: 1.5 ng/mL (ref <=4.0)

## 2018-03-24 LAB — TSH: TSH: 1.29 mIU/L (ref 0.40–4.50)

## 2018-03-24 NOTE — Progress Notes (Signed)
Name: Raymond Chambers   MRN: [id number]    DOB: 11/20/1955   Date:03/24/2018       Progress Note  Subjective  Chief Complaint  Chief Complaint  Patient presents with  . Annual Exam    Would like to come back for his medication refill appt. Just do the CPE today.    HPI  Patient presents for annual CPE .  USPSTF grade A and B recommendations:  Diet: eats mostly at home, packs his lunch most of the time. He drinks mostly water with lime. He is drinking 4 cups of coffee daily, but will try to cut down a little   Depression:  [facility] 03/24/2018 09/23/2017 11/26/2016 11/28/2015 04/21/2015  Decreased Interest 0 0 0 0 0  Down, Depressed, Hopeless 0 0 0 0 0  PHQ - 2 Score 0 0 0 0 0  Altered sleeping 0 - - - -  Tired, decreased energy 0 - - - -  Change in appetite 0 - - - -  Feeling bad or failure about yourself  0 - - - -  Trouble concentrating 0 - - - -  Moving slowly or fidgety/restless 0 - - - -  Suicidal thoughts 0 - - - -  PHQ-9 Score 0 - - - -  Difficult doing work/chores Not difficult at all - - - -    Hypertension:  BP Readings from Last 3 Encounters:  03/24/18 136/84  09/23/17 108/68  03/29/17 100/64    Obesity: Wt Readings from Last 3 Encounters:  03/24/18 156 lb 3.2 oz (70.9 kg)  09/23/17 162 lb 4.8 oz (73.6 kg)  03/29/17 165 lb (74.8 kg)   BMI Readings from Last 3 Encounters:  03/24/18 22.87 kg/m  09/23/17 22.64 kg/m  03/29/17 22.38 kg/m     Lipids:  Lab Results  Component Value Date   CHOL 174 09/23/2017   CHOL 145 06/03/2016   CHOL 258 (H) 04/17/2015   Lab Results  Component Value Date   HDL 60 09/23/2017   HDL 61 06/03/2016   HDL 54 04/17/2015   Lab Results  Component Value Date   LDLCALC 98 09/23/2017   LDLCALC 73 06/03/2016   LDLCALC 192 (H) 04/17/2015   Lab Results  Component Value Date   TRIG 75 09/23/2017   TRIG 53 06/03/2016   TRIG 61 04/17/2015   Lab Results  Component Value Date   CHOLHDL 2.9 09/23/2017    CHOLHDL 2.4 06/03/2016   CHOLHDL 4.8 04/17/2015   No results found for: LDLDIRECT Glucose:  Glucose, Bld  Date Value Ref Range Status  09/23/2017 78 65 - 99 mg/dL Final    Comment:    .            Fasting reference interval .   06/03/2016 81 65 - 99 mg/dL Final  40/98/119109/02/2015 95 65 - 99 mg/dL Final      [facility]  AUDIT-C Score  1       Married STD testing and prevention (HIV/chl/gon/syphilis):  Hep C: up to date   Skin cancer: discussed atypical lesions Colorectal cancer: up to date  Prostate cancer:   Lab Results  Component Value Date   PSA 1.1 11/26/2016   PSA Normal, 1.5 10/21/2014    IPSS Questionnaire (AUA-7): Over the past month.   1)  How often have you had a sensation of not emptying your bladder completely after you finish urinating?  0 -  Not at all  2)  How often have you had to urinate again less than two hours after you finished urinating? 1 - Less than 1 time in 5  3)  How often have you found you stopped and started again several times when you urinated?  1 - Less than 1 time in 5  4) How difficult have you found it to postpone urination?  3 - About half the time  5) How often have you had a weak urinary stream?  0 - Not at all  6) How often have you had to push or strain to begin urination?  0 - Not at all  7) How many times did you most typically get up to urinate from the time you went to bed until the time you got up in the morning?  2 - 2 times  Total score:  0-7 mildly symptomatic   8-19 moderately symptomatic   20-35 severely symptomatic   Lung cancer:   Low Dose CT Chest recommended if Age 9-80 years, 30 pack-year currently smoking OR have quit w/in 15years. Patient does not qualify.   ECG:  2016   Advanced Care Planning: A voluntary discussion about advance care planning including the explanation and discussion of advance directives.  Discussed health care proxy and Living will, and the  patient was able to identify a health care proxy as wife   Patient does not have a living will at present time. If patient does have living will, I have requested they bring this to the clinic to be scanned in to their chart.  Patient Active Problem List   Diagnosis Date Noted  . Colon cancer screening   . Rectal polyp   . Mild aortic regurgitation 11/26/2016  . History of alcoholism (HCC) 04/17/2015  . 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 Acid reflux 04/17/2015  . Herniation of nucleus pulposus 04/17/2015  . Bilateral inguinal hernia 04/17/2015  . H/O malignant neoplasm of skin 04/17/2015  . Bicuspid aortic valve 04/17/2015  . HLD (hyperlipidemia)   . Hx of transient ischemic attack (TIA) 04/16/2015    Past Surgical History:  Procedure Laterality Date  . COLONOSCOPY WITH PROPOFOL N/A 03/29/2017   Procedure: COLONOSCOPY WITH PROPOFOL;  Surgeon: Midge MiniumWohl, Darren, MD;  [facility];  Service: Endoscopy;  Laterality: N/A;  . KNEE SURGERY Left 2005    Family History  Problem Relation Age of Onset  . Cancer Father        Stomach   . Heart disease Father   . Diabetes Maternal Grandfather   . Alcohol abuse Paternal Uncle   . Obesity Brother     Social History   Socioeconomic History  . Marital status: Married    Spouse name: Herbert SetaHeather  . Number of children: 0  . Years of education: Not on file  . Highest education level: Some college, no degree  Occupational History  . Not on file  Social Needs  . Financial resource strain: Not hard at all  . Food insecurity:    Worry: Never true    Inability: Never true  . Transportation needs:    Medical: No    Non-medical: No  Tobacco Use  . Smoking status: Former Smoker    Years: 10.00    Types: Pipe    Start date: 08/10/2003    Last attempt to quit: 08/09/2013    Years since quitting: 4.6  . Smokeless tobacco: Never Used  . Tobacco comment: occasionally smoked  Substance and Sexual Activity  .  Alcohol use: Yes    Alcohol/week: 1.0 standard drinks    Types: 1 Glasses of wine per week    Comment: wine with dinner very occasionally  . Drug use: No  . Sexual activity: Yes    Partners: Female  Lifestyle  . Physical activity:    Days per week: 0 days    Minutes per session: 0 min  . Stress: Only a little  Relationships  . Social connections:    Talks on phone: More than three times a week    Gets together: Once a week    Attends religious service: Never    Active member of club or organization: No    Attends meetings of clubs or organizations: Never    Relationship status: Married  . Intimate partner violence:    Fear of current or ex partner: No    Emotionally abused: No    Physically abused: No    Forced sexual activity: No  Other Topics Concern  . Not on file  Social History Narrative  . Not on file     Current Outpatient Medications:  .  atorvastatin (LIPITOR) 80 MG tablet, Take 1 tablet (80 mg total) by mouth daily at 6 PM., Disp: 90 tablet, Rfl: 1 .  clopidogrel (PLAVIX) 75 MG tablet, Take 1 tablet (75 mg total) by mouth daily., Disp: 90 tablet, Rfl: 0 .  lisinopril (PRINIVIL,ZESTRIL) 10 MG tablet, Take 1 tablet (10 mg total) by mouth daily., Disp: 90 tablet, Rfl: 1 .  tadalafil (CIALIS) 10 MG tablet, Take 1-2 tablets (10-20 mg total) by mouth every other day as needed for erectile dysfunction., Disp: 10 tablet, Rfl: 2  Allergies  Allergen Reactions  . Penicillins Hives     ROS  Constitutional: Negative for fever , positive for weight change - he is not sure if because he did not have his boots on, at work his weight around 167 lbs.  Respiratory: Negative for cough and shortness of breath.   Cardiovascular: Negative for chest pain or palpitations.  Gastrointestinal: Negative for abdominal pain, no bowel changes.  Musculoskeletal: Negative for gait problem or joint swelling.  Skin: Negative for rash.  Neurological: Negative for dizziness or headache.  No  other specific complaints in a complete review of systems (except as listed in HPI above).  Objective  Vitals:   03/24/18 1030  BP: 136/84  Pulse: 77  Resp: 14  Temp: 98 F (36.7 C)  TempSrc: Oral  SpO2: 96%  Weight: 156 lb 3.2 oz (70.9 kg)  Height: 5' 9.29" (1.76 m)    Body mass index is 22.87 kg/m.  Physical Exam   Constitutional: Patient appears well-developed and well-nourished. No distress.  HENT: Head: Normocephalic and atraumatic. Ears: B TMs ok, no erythema or effusion; Nose: Nose normal. Mouth/Throat: Oropharynx is clear and moist. No oropharyngeal exudate.  Eyes: Conjunctivae and EOM are normal. Pupils are equal, round, and reactive to light. No scleral icterus.  Neck: Normal range of motion. Neck supple. No JVD present. No thyromegaly present.  Cardiovascular: Normal rate, regular rhythm and normal heart sounds.  No murmur heard. No BLE edema. Pulmonary/Chest: Effort normal and breath sounds normal. No respiratory distress. Abdominal: Soft. Bowel sounds are normal, no distension. There is no tenderness. no masses MALE GENITALIA: Normal descended testes bilaterally, no masses palpated, positive for inguinal hernias, no lesions, no discharge RECTAL: Prostate slightly enlarged no nodules  no rectal masses or hemorrhoids Musculoskeletal: Normal range of  motion, no joint effusions. No gross deformities Neurological: he is alert and oriented to person, place, and time. No cranial nerve deficit. Coordination, balance, strength, speech and gait are normal.  Skin: Skin is warm and dry. No rash noted. No erythema.  Psychiatric: Patient has a normal mood and affect. behavior is normal. Judgment and thought content normal.  PHQ2/9: Depression screen [facility] 2/9 03/24/2018 09/23/2017 11/26/2016 11/28/2015 04/21/2015  Decreased Interest 0 0 0 0 0  Down, Depressed, Hopeless 0 0 0 0 0  PHQ - 2 Score 0 0 0 0 0  Altered sleeping 0 - - - -  Tired, decreased energy 0 - - - -  Change in  appetite 0 - - - -  Feeling bad or failure about yourself  0 - - - -  Trouble concentrating 0 - - - -  Moving slowly or fidgety/restless 0 - - - -  Suicidal thoughts 0 - - - -  PHQ-9 Score 0 - - - -  Difficult doing work/chores Not difficult at all - - - -     Fall Risk: Fall Risk  03/24/2018 09/23/2017 11/26/2016 11/28/2015 04/21/2015  Falls in the past year? No No No No No     Functional Status Survey: Is the patient deaf or have difficulty hearing?: No Does the patient have difficulty seeing, even when wearing glasses/contacts?: Yes(glasses) Does the patient have difficulty concentrating, remembering, or making decisions?: Yes(ADD-Focusing) Does the patient have difficulty walking or climbing stairs?: No Does the patient have difficulty dressing or bathing?: No    Assessment & Plan  1. Encounter for routine history and physical exam for male  Discussed importance of 150 minutes of physical activity weekly, eat two servings of fish weekly, eat one serving of tree nuts ( cashews, pistachios, pecans, almonds.Marland Kitchen) every other day, eat 6 servings of fruit/vegetables daily and drink plenty of water and avoid sweet beverages.  - Lipid panel - COMPLETE METABOLIC PANEL WITH GFR - CBC with Differential/Platelet - TSH - PSA  2. Weight loss, non-intentional  - COMPLETE METABOLIC PANEL WITH GFR - CBC with Differential/Platelet - TSH  3. Prostate cancer screening  - PSA  4. Needs flu shot  - Flu Vaccine QUAD 6+ mos PF IM (Fluarix Quad PF)  5. Need for shingles vaccine  - Varicella-zoster vaccine IM  6. BPH associated with nocturia  He would like to hold off on medication, we will continue to monitor for now  7. Bilateral inguinal hernia without obstruction or gangrene, recurrence not specified  - Ambulatory referral to General Surgery  8. H/O malignant neoplasm of skin  - Ambulatory referral to Dermatology

## 2018-03-24 NOTE — Patient Instructions (Signed)

## 2018-03-27 ENCOUNTER — Telehealth: Payer: Self-pay

## 2018-03-27 NOTE — Telephone Encounter (Signed)
Pt's wife Raymond Chambers given results per notes of Dr. Carlynn PurlSowles on 03/26/18, patient's wife verbalized understanding. Unable to document in result note due to result note not routed to [facility].

## 2018-03-27 NOTE — Telephone Encounter (Signed)
-----   Message from Alba CoryKrichna Sowles, MD sent at 03/26/2018  9:48 PM EDT ----- Lipid panel is at goal, continue medication  Normal sugar, kidney and liver function test Normal CBC Normal TSH PSA is stable and normal

## 2018-03-27 NOTE — Telephone Encounter (Signed)
Patient called.  Unable to reach patient. If patient calls back please inform him of his most recent lab results.     

## 2018-04-20 ENCOUNTER — Encounter: Payer: Self-pay | Admitting: Surgery

## 2018-05-26 ENCOUNTER — Ambulatory Visit: Payer: Self-pay | Admitting: Surgery

## 2018-05-30 ENCOUNTER — Encounter: Payer: Self-pay | Admitting: Surgery

## 2018-06-30 ENCOUNTER — Ambulatory Visit: Payer: BLUE CROSS/BLUE SHIELD | Admitting: Family Medicine

## 2018-07-19 ENCOUNTER — Other Ambulatory Visit: Payer: Self-pay | Admitting: Family Medicine

## 2018-07-19 DIAGNOSIS — N528 Other male erectile dysfunction: Secondary | ICD-10-CM

## 2018-09-11 ENCOUNTER — Other Ambulatory Visit: Payer: Self-pay | Admitting: Family Medicine

## 2018-09-11 DIAGNOSIS — Z8673 Personal history of transient ischemic attack (TIA), and cerebral infarction without residual deficits: Secondary | ICD-10-CM

## 2018-09-11 DIAGNOSIS — I10 Essential (primary) hypertension: Secondary | ICD-10-CM

## 2018-09-11 NOTE — Telephone Encounter (Signed)
lvm to schedule appt.  

## 2018-09-11 NOTE — Telephone Encounter (Signed)
Refill request for Hypertension medication:  Lisinopril 10 mg  Last office visit pertaining to hypertension: 09/23/2017  BP Readings from Last 3 Encounters:  03/24/18 136/84  09/23/17 108/68  03/29/17 100/64    Lab Results  Component Value Date   CREATININE 0.81 03/24/2018   BUN 20 03/24/2018   NA 137 03/24/2018   K 4.2 03/24/2018   CL 105 03/24/2018   CO2 25 03/24/2018   Refill Request for Cholesterol medication. Atorvastatin 80 mg  Last physical: 03/24/2018  Lab Results  Component Value Date   CHOL 175 03/24/2018   HDL 60 03/24/2018   LDLCALC 98 03/24/2018   TRIG 83 03/24/2018   CHOLHDL 2.9 03/24/2018   Follow-ups on file. None indicated

## 2018-09-11 NOTE — Telephone Encounter (Signed)
He must be seen. Can he come in today ?

## 2018-09-13 NOTE — Telephone Encounter (Signed)
Lvm to schedule appt.

## 2018-09-25 ENCOUNTER — Other Ambulatory Visit: Payer: Self-pay | Admitting: Family Medicine

## 2018-09-25 DIAGNOSIS — Z8673 Personal history of transient ischemic attack (TIA), and cerebral infarction without residual deficits: Secondary | ICD-10-CM

## 2018-09-26 NOTE — Telephone Encounter (Signed)
Refill request for general medication. Plavix to [facility]   Last office visit 03/24/2018   No follow-ups on file.

## 2018-09-26 NOTE — Telephone Encounter (Signed)
He must be seen, last visit was CPE, due for follow up

## 2018-10-10 NOTE — Telephone Encounter (Signed)
Copied from CRM [id number]. Topic: Quick Conservator, museum/gallery Patient (Clinic Use ONLY) >> Oct 09, 2018  2:21 PM [name] B wrote: Reason for CRM: Lft message that patient needs an appt with dr for a follow up for she will refill medication

## 2018-10-11 NOTE — Telephone Encounter (Signed)
LVM for pt to call the office to schedule an appt for med refill °

## 2018-10-13 NOTE — Addendum Note (Signed)
Addended by: Cynda Familia on: 10/13/2018 04:45 PM   Modules accepted: Orders

## 2018-10-13 NOTE — Telephone Encounter (Addendum)
°  Relation to pt: self   Call back number: [phone] Pharmacy: [id number] [place], [place] - [address]  [address] [place] [address]  Phone: [phone] Fax: [phone]  Not a 24 hour pharmacy; exact hours not known.     Reason for call:  Patient stated VM was never received and patient scheduled for 11/10/2018, patient requesting clopidogrel (PLAVIX) 75 MG tablet to hold him over until appointment.

## 2018-10-15 MED ORDER — CLOPIDOGREL BISULFATE 75 MG PO TABS: 75.0000 mg | ORAL_TABLET | Freq: Every day | ORAL | 0 refills | Status: DC

## 2018-10-15 NOTE — Addendum Note (Signed)
Addended by: Ruel Favors on: 10/15/2018 08:57 PM   Modules accepted: Orders

## 2018-11-10 ENCOUNTER — Encounter: Payer: Self-pay | Admitting: Family Medicine

## 2018-11-10 ENCOUNTER — Ambulatory Visit (INDEPENDENT_AMBULATORY_CARE_PROVIDER_SITE_OTHER): Payer: BLUE CROSS/BLUE SHIELD | Admitting: Family Medicine

## 2018-11-10 VITALS — BP 136/95 | HR 85 | Temp 97.4°F | Ht 69.29 in | Wt 158.0 lb

## 2018-11-10 DIAGNOSIS — I351 Nonrheumatic aortic (valve) insufficiency: Secondary | ICD-10-CM

## 2018-11-10 DIAGNOSIS — E7800 Pure hypercholesterolemia, unspecified: Secondary | ICD-10-CM | POA: Diagnosis not present

## 2018-11-10 DIAGNOSIS — F1021 Alcohol dependence, in remission: Secondary | ICD-10-CM | POA: Diagnosis not present

## 2018-11-10 DIAGNOSIS — D692 Other nonthrombocytopenic purpura: Secondary | ICD-10-CM | POA: Diagnosis not present

## 2018-11-10 DIAGNOSIS — Z8673 Personal history of transient ischemic attack (TIA), and cerebral infarction without residual deficits: Secondary | ICD-10-CM

## 2018-11-10 DIAGNOSIS — N528 Other male erectile dysfunction: Secondary | ICD-10-CM

## 2018-11-10 DIAGNOSIS — I10 Essential (primary) hypertension: Secondary | ICD-10-CM

## 2018-11-10 DIAGNOSIS — K219 Gastro-esophageal reflux disease without esophagitis: Secondary | ICD-10-CM

## 2018-11-10 MED ORDER — SILDENAFIL CITRATE 100 MG PO TABS: 100.0000 mg | ORAL_TABLET | Freq: Every day | ORAL | 0 refills | Status: DC | PRN

## 2018-11-10 MED ORDER — CLOPIDOGREL BISULFATE 75 MG PO TABS: 75.0000 mg | ORAL_TABLET | Freq: Every day | ORAL | 1 refills | Status: DC

## 2018-11-10 MED ORDER — LISINOPRIL 10 MG PO TABS: 10.0000 mg | ORAL_TABLET | Freq: Every day | ORAL | 1 refills | Status: DC

## 2018-11-10 MED ORDER — ATORVASTATIN CALCIUM 80 MG PO TABS: 80.0000 mg | ORAL_TABLET | Freq: Every day | ORAL | 1 refills | Status: DC

## 2018-11-10 NOTE — Progress Notes (Signed)
Name: Raymond Chambers   MRN: [id number]    DOB: Oct 20, 1955   Date:11/10/2018       Progress Note  Subjective  Chief Complaint  Chief Complaint  Patient presents with  . Medication Refill    Refills on Cholesterol and Plavix-90 day prescriptions  . Hypertension    Denies any symptoms  . Hyperlipidemia  . Transient Ischemic Attack  . Edema    I connected with@ on 11/10/18 at 11:40 AM EDT by a video enabled telemedicine application and verified that I am speaking with the correct person using two identifiers.  I discussed the limitations of evaluation and management by telemedicine and the availability of in person appointments. The patient expressed understanding and agreed to proceed. Staff also discussed with the patient that there may be a patient responsible charge related to this service. Patient Location: home  Provider Location: [facility] Additional Individuals present: wife  HPI  Hyperlipidemia: l He has occasional calf cramps, usually at night. Discussed hydration and stretching before bed. he has been out of atorvastatin and explained importance of compliance   HTN: his bp is a little hight today but he has been out of medication for the past week. He denies chest pain or palpitation. Usually in the 120's range   TIA: he was at work on 04/2015 and developed acute onset of dizziness. Followed by left facial tingling, left leg and arm tingling and heaviness, the episode lasted about 2 minutes . He went to [facility] and transferred to [facility], he had multiple tests done, abnormal findings ( stable mild apical pleural thickening and bicuspid Aortic Valve, increase in in subcortical hyper intensities slightly greater than expected for age) and elevated LDL. He has not been compliant with Atorvastatin and Plavix. Marland Kitchen He was advised to see cardiologist and neurologist since 2016 but never made appointment he states we can discuss it again during the Fall after  COVID-19 passes   History of alcoholism: used to be a heavy drinker, currently only drinks one glass of wine with wife about once a week.Unchanged   ED: he is able to have an erection but unable to maintain, he likes Viagra better than Cialis, so we will go back to viagra.  Symptoms going on for the past few years and stable, he states medication helps maintain the erection .   GERD: wife has noticed that he gets indigestion occasionally at night that resolves with Tums, he states usually when he eats prior to going to bed, discussed GERD diet  Patient Active Problem List   Diagnosis Date Noted  . BPH associated with nocturia 03/24/2018  . Colon cancer screening   . Rectal polyp   . Mild aortic regurgitation 11/26/2016  . History of alcoholism (HCC) 04/17/2015  . 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 Acid reflux 04/17/2015  . Herniation of nucleus pulposus 04/17/2015  . Bilateral inguinal hernia 04/17/2015  . H/O malignant neoplasm of skin 04/17/2015  . Bicuspid aortic valve 04/17/2015  . HLD (hyperlipidemia)   . Hx of transient ischemic attack (TIA) 04/16/2015    Past Surgical History:  Procedure Laterality Date  . COLONOSCOPY WITH PROPOFOL N/A 03/29/2017   Procedure: COLONOSCOPY WITH PROPOFOL;  Surgeon: Midge Minium, MD;  Location: [facility] END[facility]COPY;  Service: Endoscopy;  Laterality: N/A;  . KNEE SURGERY Left 2005    Family History  Problem Relation Age of Onset  . Cancer Father        Stomach   .  Heart disease Father   . Diabetes Maternal Grandfather   . Alcohol abuse Paternal Uncle   . Obesity Brother     Social History   Socioeconomic History  . Marital status: Married    Spouse name: Herbert Seta  . Number of children: 0  . Years of education: Not on file  . Highest education level: Some college, no degree  Occupational History  . Not on file  Social Needs  . Financial resource strain: Not hard at all  . Food insecurity:    Worry: Never  true    Inability: Never true  . Transportation needs:    Medical: No    Non-medical: No  Tobacco Use  . Smoking status: Former Smoker    Years: 10.00    Types: Pipe    Start date: 08/10/2003    Last attempt to quit: 08/09/2013    Years since quitting: 5.2  . Smokeless tobacco: Never Used  Substance and Sexual Activity  . Alcohol use: Yes    Alcohol/week: 1.0 standard drinks    Types: 1 Glasses of wine per week    Comment: wine with dinner very occasionally  . Drug use: No  . Sexual activity: Yes    Partners: Female  Lifestyle  . Physical activity:    Days per week: 0 days    Minutes per session: 0 min  . Stress: Only a little  Relationships  . Social connections:    Talks on phone: More than three times a week    Gets together: Once a week    Attends religious service: Never    Active member of club or organization: No    Attends meetings of clubs or organizations: Never    Relationship status: Married  . Intimate partner violence:    Fear of current or ex partner: No    Emotionally abused: No    Physically abused: No    Forced sexual activity: No  Other Topics Concern  . Not on file  Social History Narrative  . Not on file     Current Outpatient Medications:  .  atorvastatin (LIPITOR) 80 MG tablet, Take 1 tablet (80 mg total) by mouth daily at 6 PM., Disp: 90 tablet, Rfl: 1 .  clopidogrel (PLAVIX) 75 MG tablet, Take 1 tablet (75 mg total) by mouth daily., Disp: 30 tablet, Rfl: 0 .  lisinopril (PRINIVIL,ZESTRIL) 10 MG tablet, Take 1 tablet (10 mg total) by mouth daily., Disp: 90 tablet, Rfl: 1 .  tadalafil (CIALIS) 10 MG tablet, TAKE 1-2 TABLETS (10-20 MG TOTAL) BY MOUTH EVERY OTHER DAY AS NEEDED FOR ERECTILE DYSFUNCTION., Disp: 6 tablet, Rfl: 4  Allergies  Allergen Reactions  . Penicillins Hives    I personally reviewed active problem list, medication list, allergies, family history, social history with the patient/caregiver today.   ROS  Ten systems  reviewed and is negative except as mentioned in HPI   Objective  Virtual encounter, vitals not obtained.  Body mass index is 23.14 kg/m.  Physical Exam  Awake, alert, oriented   PHQ2/9: Depression screen [facility] 2/9 11/10/2018 03/24/2018 09/23/2017 11/26/2016 11/28/2015  Decreased Interest 0 0 0 0 0  Down, Depressed, Hopeless 0 0 0 0 0  PHQ - 2 Score 0 0 0 0 0  Altered sleeping 0 0 - - -  Tired, decreased energy 0 0 - - -  Change in appetite 0 0 - - -  Feeling bad or failure about yourself  0 0 - - -  Trouble concentrating 1 0 - - -  Moving slowly or fidgety/restless 0 0 - - -  Suicidal thoughts 0 0 - - -  PHQ-9 Score 1 0 - - -  Difficult doing work/chores Not difficult at all Not difficult at all - - -   PHQ-2/9 Result is negative.    Fall Risk: Fall Risk  11/10/2018 03/24/2018 09/23/2017 11/26/2016 11/28/2015  Falls in the past year? 0 No No No No  Number falls in past yr: 0 - - - -  Injury with Fall? 0 - - - -    Assessment & Plan  1. Pure hypercholesterolemia  - atorvastatin (LIPITOR) 80 MG tablet; Take 1 tablet (80 mg total) by mouth daily at 6 PM.  Dispense: 90 tablet; Refill: 1  2. Senile purpura (HCC)   3. History of alcoholism (HCC)   4. Hypertension, benign  - lisinopril (PRINIVIL,ZESTRIL) 10 MG tablet; Take 1 tablet (10 mg total) by mouth daily.  Dispense: 90 tablet; Refill: 1  5. Hx of transient ischemic attack (TIA)  - atorvastatin (LIPITOR) 80 MG tablet; Take 1 tablet (80 mg total) by mouth daily at 6 PM.  Dispense: 90 tablet; Refill: 1 - clopidogrel (PLAVIX) 75 MG tablet; Take 1 tablet (75 mg total) by mouth daily.  Dispense: 90 tablet; Refill: 1  6. Mild aortic regurgitation  Needs to see cardiologist but he will wait until the fall   7. Other male erectile dysfunction  - sildenafil (VIAGRA) 100 MG tablet; Take 1 tablet (100 mg total) by mouth daily as needed for erectile dysfunction.  Dispense: 30 tablet; Refill: 0  8. Gastroesophageal reflux disease  without esophagitis  Discussed life style modification   I discussed the assessment and treatment plan with the patient. The patient was provided an opportunity to ask questions and all were answered. The patient agreed with the plan and demonstrated an understanding of the instructions.  The patient was advised to call back or seek an in-person evaluation if the symptoms worsen or if the condition fails to improve as anticipated.  I provided 25 minutes of non-face-to-face time during this encounter.

## 2019-04-27 DIAGNOSIS — H5203 Hypermetropia, bilateral: Secondary | ICD-10-CM | POA: Diagnosis not present

## 2019-04-27 DIAGNOSIS — H35032 Hypertensive retinopathy, left eye: Secondary | ICD-10-CM | POA: Diagnosis not present

## 2019-04-27 DIAGNOSIS — H52223 Regular astigmatism, bilateral: Secondary | ICD-10-CM | POA: Diagnosis not present

## 2019-04-27 DIAGNOSIS — H524 Presbyopia: Secondary | ICD-10-CM | POA: Diagnosis not present

## 2019-05-11 ENCOUNTER — Encounter: Payer: BLUE CROSS/BLUE SHIELD | Admitting: Family Medicine

## 2019-08-14 ENCOUNTER — Ambulatory Visit: Payer: BC Managed Care – PPO | Attending: Internal Medicine

## 2019-08-14 DIAGNOSIS — Z20822 Contact with and (suspected) exposure to covid-19: Secondary | ICD-10-CM | POA: Diagnosis not present

## 2019-08-15 ENCOUNTER — Telehealth: Payer: Self-pay

## 2019-08-15 NOTE — Telephone Encounter (Signed)
Pt called to get covid test results. Results still pending.   Raymond Chambers  

## 2019-08-16 LAB — NOVEL CORONAVIRUS, NAA: SARS-CoV-2, NAA: NOT DETECTED

## 2019-08-17 ENCOUNTER — Encounter: Payer: BC Managed Care – PPO | Admitting: Family Medicine

## 2019-08-17 ENCOUNTER — Telehealth: Payer: Self-pay

## 2019-08-17 NOTE — Telephone Encounter (Signed)
Caller given negative result and verbalized understanding  

## 2019-11-16 ENCOUNTER — Other Ambulatory Visit: Payer: Self-pay | Admitting: Family Medicine

## 2019-11-16 DIAGNOSIS — N528 Other male erectile dysfunction: Secondary | ICD-10-CM

## 2019-11-16 NOTE — Telephone Encounter (Signed)
Attempted to call patient- left message to make appointment- courtesy refill given.

## 2019-11-24 ENCOUNTER — Other Ambulatory Visit: Payer: Self-pay | Admitting: Family Medicine

## 2019-11-24 DIAGNOSIS — I10 Essential (primary) hypertension: Secondary | ICD-10-CM

## 2019-11-24 DIAGNOSIS — Z8673 Personal history of transient ischemic attack (TIA), and cerebral infarction without residual deficits: Secondary | ICD-10-CM

## 2019-11-24 NOTE — Telephone Encounter (Signed)
> 3 months overdue for OV, pt with upcoming appt in 20 days. Refilled with enough medication to last until future visit Requested Prescriptions  Pending Prescriptions Disp Refills  . clopidogrel (PLAVIX) 75 MG tablet [Pharmacy Med Name: CLOPIDOGREL 75 MG TABLET] 20 tablet 0    Sig: TAKE 1 TABLET BY MOUTH EVERY DAY     Hematology: Antiplatelets - clopidogrel Failed - 11/24/2019  9:43 AM      Failed - Evaluate AST, ALT within 2 months of therapy initiation.      Failed - ALT in normal range and within 360 days    ALT  Date Value Ref Range Status  03/24/2018 15 9 - 46 U/L Final         Failed - AST in normal range and within 360 days    AST  Date Value Ref Range Status  03/24/2018 17 10 - 35 U/L Final         Failed - HCT in normal range and within 180 days    HCT  Date Value Ref Range Status  03/24/2018 42.8 38.5 - 50.0 % Final         Failed - HGB in normal range and within 180 days    Hemoglobin  Date Value Ref Range Status  03/24/2018 14.5 13.2 - 17.1 g/dL Final         Failed - PLT in normal range and within 180 days    Platelets  Date Value Ref Range Status  03/24/2018 250 140 - 400 Thousand/uL Final         Failed - Valid encounter within last 6 months    Recent Outpatient Visits          1 year ago Pure hypercholesterolemia   [facility] Alba Cory, MD   1 year ago Encounter for routine history and physical exam for male   [facility] Alba Cory, MD   2 years ago Pure hypercholesterolemia   [facility] Alba Cory, MD   2 years ago Encounter for routine history and physical exam for male   [facility] Alba Cory, MD   3 years ago Pure hypercholesterolemia   [facility] Alba Cory, MD      Future Appointments            In 2 weeks Alba Cory, MD [facility]           . lisinopril (ZESTRIL) 10 MG  tablet [Pharmacy Med Name: LISINOPRIL 10 MG TABLET] 20 tablet 0    Sig: TAKE 1 TABLET BY MOUTH EVERY DAY     Cardiovascular:  ACE Inhibitors Failed - 11/24/2019  9:43 AM      Failed - Cr in normal range and within 180 days    Creat  Date Value Ref Range Status  03/24/2018 0.81 0.70 - 1.25 mg/dL Final    Comment:    For patients >39 years of age, the reference limit for Creatinine is approximately 13% higher for people identified as African-American. .          Failed - K in normal range and within 180 days    Potassium  Date Value Ref Range Status  03/24/2018 4.2 3.5 - 5.3 mmol/L Final         Failed - Last BP in normal range    BP Readings from Last 1 Encounters:  11/10/18 (!) 136/95  Failed - Valid encounter within last 6 months    Recent Outpatient Visits          1 year ago Pure hypercholesterolemia   [facility] Steele Sizer, MD   1 year ago Encounter for routine history and physical exam for male   [facility] Steele Sizer, MD   2 years ago Pure hypercholesterolemia   [facility] Steele Sizer, MD   2 years ago Encounter for routine history and physical exam for male   [facility] Steele Sizer, MD   3 years ago Pure hypercholesterolemia   [facility] Steele Sizer, MD      Future Appointments            In 2 weeks Steele Sizer, MD [facility] [place] - Patient is not pregnant

## 2019-12-10 ENCOUNTER — Other Ambulatory Visit: Payer: Self-pay | Admitting: Family Medicine

## 2019-12-10 DIAGNOSIS — N528 Other male erectile dysfunction: Secondary | ICD-10-CM

## 2019-12-10 NOTE — Telephone Encounter (Signed)
Requested medication (s) are due for refill today: {yes  Requested medication (s) are on the active medication list: yes  Last refill: 11/16/19 Courtesy refill  #6  0 refills  Future visit scheduled:yes 12/14/19  Notes to clinic: Dx code needed     Requested Prescriptions  Pending Prescriptions Disp Refills   sildenafil (VIAGRA) 100 MG tablet [Pharmacy Med Name: SILDENAFIL 100 MG TABLET] 6 tablet 0    Sig: TAKE 1 TABLET BY MOUTH EVERY DAY AS NEEDED FOR ERECTILE DYSFUNCTION      Urology: Erectile Dysfunction Agents Failed - 12/10/2019  4:29 PM      Failed - Last BP in normal range    BP Readings from Last 1 Encounters:  11/10/18 (!) 136/95          Failed - Valid encounter within last 12 months    Recent Outpatient Visits           1 year ago Pure hypercholesterolemia   [facility] Alba Cory, MD   1 year ago Encounter for routine history and physical exam for male   [facility] Alba Cory, MD   2 years ago Pure hypercholesterolemia   [facility] Alba Cory, MD   3 years ago Encounter for routine history and physical exam for male   [facility] Alba Cory, MD   3 years ago Pure hypercholesterolemia   [facility] Alba Cory, MD       Future Appointments             In 4 days Alba Cory, MD [facility]

## 2019-12-14 ENCOUNTER — Other Ambulatory Visit: Payer: Self-pay

## 2019-12-14 ENCOUNTER — Encounter: Payer: Self-pay | Admitting: Family Medicine

## 2019-12-14 ENCOUNTER — Ambulatory Visit (INDEPENDENT_AMBULATORY_CARE_PROVIDER_SITE_OTHER): Payer: BC Managed Care – PPO | Admitting: Family Medicine

## 2019-12-14 VITALS — BP 110/90 | HR 81 | Temp 97.3°F | Resp 16 | Ht 69.25 in | Wt 159.7 lb

## 2019-12-14 DIAGNOSIS — R0600 Dyspnea, unspecified: Secondary | ICD-10-CM

## 2019-12-14 DIAGNOSIS — E7800 Pure hypercholesterolemia, unspecified: Secondary | ICD-10-CM | POA: Diagnosis not present

## 2019-12-14 DIAGNOSIS — F1021 Alcohol dependence, in remission: Secondary | ICD-10-CM

## 2019-12-14 DIAGNOSIS — Z0000 Encounter for general adult medical examination without abnormal findings: Secondary | ICD-10-CM | POA: Diagnosis not present

## 2019-12-14 DIAGNOSIS — N401 Enlarged prostate with lower urinary tract symptoms: Secondary | ICD-10-CM

## 2019-12-14 DIAGNOSIS — L609 Nail disorder, unspecified: Secondary | ICD-10-CM

## 2019-12-14 DIAGNOSIS — R351 Nocturia: Secondary | ICD-10-CM | POA: Diagnosis not present

## 2019-12-14 DIAGNOSIS — I10 Essential (primary) hypertension: Secondary | ICD-10-CM | POA: Diagnosis not present

## 2019-12-14 DIAGNOSIS — D692 Other nonthrombocytopenic purpura: Secondary | ICD-10-CM

## 2019-12-14 DIAGNOSIS — Z8673 Personal history of transient ischemic attack (TIA), and cerebral infarction without residual deficits: Secondary | ICD-10-CM

## 2019-12-14 DIAGNOSIS — I351 Nonrheumatic aortic (valve) insufficiency: Secondary | ICD-10-CM | POA: Diagnosis not present

## 2019-12-14 DIAGNOSIS — K219 Gastro-esophageal reflux disease without esophagitis: Secondary | ICD-10-CM

## 2019-12-14 DIAGNOSIS — Z131 Encounter for screening for diabetes mellitus: Secondary | ICD-10-CM

## 2019-12-14 DIAGNOSIS — R0609 Other forms of dyspnea: Secondary | ICD-10-CM

## 2019-12-14 DIAGNOSIS — Z125 Encounter for screening for malignant neoplasm of prostate: Secondary | ICD-10-CM | POA: Diagnosis not present

## 2019-12-14 DIAGNOSIS — N528 Other male erectile dysfunction: Secondary | ICD-10-CM

## 2019-12-14 MED ORDER — ATORVASTATIN CALCIUM 80 MG PO TABS: 80.0000 mg | ORAL_TABLET | Freq: Every day | ORAL | 1 refills | Status: DC

## 2019-12-14 MED ORDER — CLOPIDOGREL BISULFATE 75 MG PO TABS: 75.0000 mg | ORAL_TABLET | Freq: Every day | ORAL | 1 refills | Status: DC

## 2019-12-14 MED ORDER — LISINOPRIL 10 MG PO TABS: 10.0000 mg | ORAL_TABLET | Freq: Every day | ORAL | 1 refills | Status: DC

## 2019-12-14 MED ORDER — FAMOTIDINE 40 MG PO TABS: 40.0000 mg | ORAL_TABLET | Freq: Every day | ORAL | 1 refills | Status: DC

## 2019-12-14 MED ORDER — SILDENAFIL CITRATE 100 MG PO TABS: 100.0000 mg | ORAL_TABLET | ORAL | 1 refills | Status: DC | PRN

## 2019-12-14 NOTE — Patient Instructions (Signed)
Preventive Care 41-64 Years Old, Male Preventive care refers to lifestyle choices and visits with your health care provider that can promote health and wellness. This includes:  A yearly physical exam. This is also called an annual well check.  Regular dental and eye exams.  Immunizations.  Screening for certain conditions.  Healthy lifestyle choices, such as eating a healthy diet, getting regular exercise, not using drugs or products that contain nicotine and tobacco, and limiting alcohol use. What can I expect for my preventive care visit? Physical exam Your health care provider will check:  Height and weight. These may be used to calculate body mass index (BMI), which is a measurement that tells if you are at a healthy weight.  Heart rate and blood pressure.  Your skin for abnormal spots. Counseling Your health care provider may ask you questions about:  Alcohol, tobacco, and drug use.  Emotional well-being.  Home and relationship well-being.  Sexual activity.  Eating habits.  Work and work Statistician. What immunizations do I need?  Influenza (flu) vaccine  This is recommended every year. Tetanus, diphtheria, and pertussis (Tdap) vaccine  You may need a Td booster every 10 years. Varicella (chickenpox) vaccine  You may need this vaccine if you have not already been vaccinated. Zoster (shingles) vaccine  You may need this after age 64. Measles, mumps, and rubella (MMR) vaccine  You may need at least one dose of MMR if you were born in 1957 or later. You may also need a second dose. Pneumococcal conjugate (PCV13) vaccine  You may need this if you have certain conditions and were not previously vaccinated. Pneumococcal polysaccharide (PPSV23) vaccine  You may need one or two doses if you smoke cigarettes or if you have certain conditions. Meningococcal conjugate (MenACWY) vaccine  You may need this if you have certain conditions. Hepatitis A  vaccine  You may need this if you have certain conditions or if you travel or work in places where you may be exposed to hepatitis A. Hepatitis B vaccine  You may need this if you have certain conditions or if you travel or work in places where you may be exposed to hepatitis B. Haemophilus influenzae type b (Hib) vaccine  You may need this if you have certain risk factors. Human papillomavirus (HPV) vaccine  If recommended by your health care provider, you may need three doses over 6 months. You may receive vaccines as individual doses or as more than one vaccine together in one shot (combination vaccines). Talk with your health care provider about the risks and benefits of combination vaccines. What tests do I need? Blood tests  Lipid and cholesterol levels. These may be checked every 5 years, or more frequently if you are over 60 years old.  Hepatitis C test.  Hepatitis B test. Screening  Lung cancer screening. You may have this screening every year starting at age 43 if you have a 30-pack-year history of smoking and currently smoke or have quit within the past 15 years.  Prostate cancer screening. Recommendations will vary depending on your family history and other risks.  Colorectal cancer screening. All adults should have this screening starting at age 72 and continuing until age 2. Your health care provider may recommend screening at age 14 if you are at increased risk. You will have tests every 1-10 years, depending on your results and the type of screening test.  Diabetes screening. This is done by checking your blood sugar (glucose) after you have not eaten  for a while (fasting). You may have this done every 1-3 years.  Sexually transmitted disease (STD) testing. Follow these instructions at home: Eating and drinking  Eat a diet that includes fresh fruits and vegetables, whole grains, lean protein, and low-fat dairy products.  Take vitamin and mineral supplements as  recommended by your health care provider.  Do not drink alcohol if your health care provider tells you not to drink.  If you drink alcohol: ? Limit how much you have to 0-2 drinks a day. ? Be aware of how much alcohol is in your drink. In the [place] one drink equals one 12 oz bottle of beer (355 mL), one 5 oz glass of wine (148 mL), or one 1 oz glass of hard liquor (44 mL). Lifestyle  Take daily care of your teeth and gums.  Stay active. Exercise for at least 30 minutes on 5 or more days each week.  Do not use any products that contain nicotine or tobacco, such as cigarettes, e-cigarettes, and chewing tobacco. If you need help quitting, ask your health care provider.  If you are sexually active, practice safe sex. Use a condom or other form of protection to prevent STIs (sexually transmitted infections).  Talk with your health care provider about taking a low-dose aspirin every day starting at age 53. What's next?  Go to your health care provider once a year for a well check visit.  Ask your health care provider how often you should have your eyes and teeth checked.  Stay up to date on all vaccines. This information is not intended to replace advice given to you by your health care provider. Make sure you discuss any questions you have with your health care provider. Document Revised: 07/20/2018 Document Reviewed: 07/20/2018 Elsevier Patient Education  2020 [place] for Gastroesophageal Reflux Disease, Adult When you have gastroesophageal reflux disease (GERD), the foods you eat and your eating habits are very important. Choosing the right foods can help ease the discomfort of GERD. Consider working with a diet and nutrition specialist (dietitian) to help you make healthy food choices. What general guidelines should I follow?  Eating plan  Choose healthy foods low in fat, such as fruits, vegetables, whole grains, low-fat dairy products, and lean meat, fish, and  poultry.  Eat frequent, small meals instead of three large meals each day. Eat your meals slowly, in a relaxed setting. Avoid bending over or lying down until 2-3 hours after eating.  Limit high-fat foods such as fatty meats or fried foods.  Limit your intake of oils, butter, and shortening to less than 8 teaspoons each day.  Avoid the following: ? Foods that cause symptoms. These may be different for different people. Keep a food diary to keep track of foods that cause symptoms. ? Alcohol. ? Drinking large amounts of liquid with meals. ? Eating meals during the 2-3 hours before bed.  Cook foods using methods other than frying. This may include baking, grilling, or broiling. Lifestyle  Maintain a healthy weight. Ask your health care provider what weight is healthy for you. If you need to lose weight, work with your health care provider to do so safely.  Exercise for at least 30 minutes on 5 or more days each week, or as told by your health care provider.  Avoid wearing clothes that fit tightly around your waist and chest.  Do not use any products that contain nicotine or tobacco, such as cigarettes and e-cigarettes. If you need  help quitting, ask your health care provider.  Sleep with the head of your bed raised. Use a wedge under the mattress or blocks under the bed frame to raise the head of the bed. What foods are not recommended? The items listed may not be a complete list. Talk with your dietitian about what dietary choices are best for you. Grains Pastries or quick breads with added fat. [place] toast. Vegetables Deep fried vegetables. [place] fries. Any vegetables prepared with added fat. Any vegetables that cause symptoms. For some people this may include tomatoes and tomato products, chili peppers, onions and garlic, and horseradish. Fruits Any fruits prepared with added fat. Any fruits that cause symptoms. For some people this may include citrus fruits, such as oranges,  grapefruit, pineapple, and lemons. Meats and other protein foods High-fat meats, such as fatty beef or pork, hot dogs, ribs, ham, sausage, salami and bacon. Fried meat or protein, including fried fish and fried chicken. Nuts and nut butters. Dairy Whole milk and chocolate milk. Sour cream. Cream. Ice cream. Cream cheese. Milk shakes. Beverages Coffee and tea, with or without caffeine. Carbonated beverages. Sodas. Energy drinks. Fruit juice made with acidic fruits (such as orange or grapefruit). Tomato juice. Alcoholic drinks. Fats and oils Butter. Margarine. Shortening. Ghee. Sweets and desserts Chocolate and cocoa. Donuts. Seasoning and other foods Pepper. Peppermint and spearmint. Any condiments, herbs, or seasonings that cause symptoms. For some people, this may include curry, hot sauce, or vinegar-based salad dressings. Summary  When you have gastroesophageal reflux disease (GERD), food and lifestyle choices are very important to help ease the discomfort of GERD.  Eat frequent, small meals instead of three large meals each day. Eat your meals slowly, in a relaxed setting. Avoid bending over or lying down until 2-3 hours after eating.  Limit high-fat foods such as fatty meat or fried foods. This information is not intended to replace advice given to you by your health care provider. Make sure you discuss any questions you have with your health care provider. Document Revised: 11/16/2018 Document Reviewed: 07/27/2016 Elsevier Patient Education  [place].

## 2019-12-14 NOTE — Progress Notes (Signed)
Name: Raymond ChinaJeffrey A Crisman   MRN: [id number]    DOB: 07/20/1956   Date:12/14/2019       Progress Note  Subjective  Chief Complaint  Chief Complaint  Patient presents with  . Annual Exam    HPI  Patient presents for annual CPE and follow up  Hyperlipidemia: taking Atorvastatin as prescribed, he states leg cramps have improved   HTN:his bp is a little hight today but he has been out of medication for the past week. He denies chest pain or palpitation. Usually in the 120's range   TIA: he was at work on 09/2016and developed acute onset of dizziness. Followed by left facial tingling, left leg and arm tingling and heaviness, the episode lasted about 2 minutes . [facility] and [facility], he had multiple tests done, abnormal findings ( stable mild apical pleural thickening and bicuspid Aortic Valve, increase in in subcortical hyper intensities slightly greater than expected for age) and elevated LDL. He has not been compliant with Atorvastatin and Plavix. He is willing to see cardiologist and neurologist   SOB with activity: he can only due the elliptical for 3 minutes.   History of alcoholism: used to be a heavy drinker, currently only drinks one glass of wine with wife a few times a week.   ED: he is able to have an erection but unable to maintain, doing well on viagra   GERD: he states worse symptoms at night, waking up during the night with substernal burning. Discussed not eating for 2-3 hours before bed, discussed GERD appropriate diet and we will try pepcid for now   USPSTF grade A and B recommendations:  Diet: balanced Exercise: not enough   Depression: phq 9 is negative Depression screen [facility] 2/9 12/14/2019 11/10/2018 03/24/2018 09/23/2017 11/26/2016  Decreased Interest 0 0 0 0 0  Down, Depressed, Hopeless 0 0 0 0 0  PHQ - 2 Score 0 0 0 0 0  Altered sleeping 0 0 0 - -  Tired, decreased energy 0 0 0 - -  Change in appetite 0 0 0 - -  Feeling bad or  failure about yourself  0 0 0 - -  Trouble concentrating 0 1 0 - -  Moving slowly or fidgety/restless 0 0 0 - -  Suicidal thoughts 0 0 0 - -  PHQ-9 Score 0 1 0 - -  Difficult doing work/chores - Not difficult at all Not difficult at all - -    Hypertension:  BP Readings from Last 3 Encounters:  12/14/19 110/90  11/10/18 (!) 136/95  03/24/18 136/84    Obesity: Wt Readings from Last 3 Encounters:  12/14/19 159 lb 11.2 oz (72.4 kg)  11/10/18 158 lb (71.7 kg)  03/24/18 156 lb 3.2 oz (70.9 kg)   BMI Readings from Last 3 Encounters:  12/14/19 23.41 kg/m  11/10/18 23.14 kg/m  03/24/18 22.87 kg/m     Lipids:  Lab Results  Component Value Date   CHOL 175 03/24/2018   CHOL 174 09/23/2017   CHOL 145 06/03/2016   Lab Results  Component Value Date   HDL 60 03/24/2018   HDL 60 09/23/2017   HDL 61 06/03/2016   Lab Results  Component Value Date   LDLCALC 98 03/24/2018   LDLCALC 98 09/23/2017   LDLCALC 73 06/03/2016   Lab Results  Component Value Date   TRIG 83 03/24/2018   TRIG 75 09/23/2017   TRIG 53 06/03/2016   Lab Results  Component Value Date   CHOLHDL 2.9 03/24/2018   CHOLHDL 2.9 09/23/2017   CHOLHDL 2.4 06/03/2016   No results found for: LDLDIRECT Glucose:  Glucose, Bld  Date Value Ref Range Status  03/24/2018 80 65 - 99 mg/dL Final    Comment:    .            Fasting reference interval .   09/23/2017 78 65 - 99 mg/dL Final    Comment:    .            Fasting reference interval .   06/03/2016 81 65 - 99 mg/dL Final      [facility]  AUDIT-C Score  3       Married STD testing and prevention (HIV/chl/gon/syphilis): N/A Hep C: 01/08/2013  Skin cancer: Discussed monitoring for atypical lesions Colorectal cancer: repeat 2023  Prostate cancer: check today  Lab Results  Component Value Date   PSA 1.5 03/24/2018   PSA 1.1 11/26/2016   PSA Normal, 1.5 10/21/2014    IPSS Questionnaire  (AUA-7): Over the past month.   1)  How often have you had a sensation of not emptying your bladder completely after you finish urinating?  0 - Not at all  2)  How often have you had to urinate again less than two hours after you finished urinating? 0 - Not at all  3)  How often have you found you stopped and started again several times when you urinated?  0 - Not at all  4) How difficult have you found it to postpone urination?  0 - Not at all  5) How often have you had a weak urinary stream?  0 - Not at all  6) How often have you had to push or strain to begin urination?  0 - Not at all  7) How many times did you most typically get up to urinate from the time you went to bed until the time you got up in the morning?  2 - 2 times  Total score:  0-7 mildly symptomatic   8-19 moderately symptomatic   20-35 severely symptomatic    Lung cancer:  Low Dose CT Chest recommended if Age 70-80 years, 30 pack-year currently smoking OR have quit w/in 15years. Patient does not qualify.   AAA: The USPSTF recommends one-time screening with ultrasonography in men ages 80 to 84 years who have ever smoked ECG:  2016  Advanced Care Planning: A voluntary discussion about advance care planning including the explanation and discussion of advance directives.  Discussed health care proxy and Living will, and the patient was able to identify a health care proxy as wife .  Patient does not have a living will at present time.   Patient Active Problem List   Diagnosis Date Noted  . BPH associated with nocturia 03/24/2018  . Colon cancer screening   . Rectal polyp   . Mild aortic regurgitation 11/26/2016  . History of alcoholism (HCC) 04/17/2015  . 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 Acid reflux 04/17/2015  . Herniation of nucleus pulposus 04/17/2015  . Bilateral inguinal hernia 04/17/2015  . H/O malignant neoplasm of skin 04/17/2015  . Bicuspid aortic valve 04/17/2015  . HLD  (hyperlipidemia)   . Hx of transient ischemic attack (TIA) 04/16/2015    Past Surgical History:  Procedure Laterality Date  . COLONOSCOPY WITH PROPOFOL N/A 03/29/2017   Procedure: COLONOSCOPY WITH PROPOFOL;  Surgeon:  Midge Minium, MD;  Location: [facility] ENDOSCOPY;  Service: Endoscopy;  Laterality: N/A;  . KNEE SURGERY Left 2005    Family History  Problem Relation Age of Onset  . Cancer Father        Stomach   . Heart disease Father   . Diabetes Maternal Grandfather   . Alcohol abuse Paternal Uncle   . Obesity Brother     Social History   Socioeconomic History  . Marital status: Married    Spouse name: Herbert Seta  . Number of children: 0  . Years of education: Not on file  . Highest education level: Some college, no degree  Occupational History  . Not on file  Tobacco Use  . Smoking status: Former Smoker    Years: 10.00    Types: Pipe    Start date: 08/10/2003    Quit date: 08/09/2013    Years since quitting: 6.3  . Smokeless tobacco: Never Used  Substance and Sexual Activity  . Alcohol use: Yes    Alcohol/week: 1.0 standard drinks    Types: 1 Glasses of wine per week    Comment: wine with dinner very occasionally  . Drug use: No  . Sexual activity: Yes    Partners: Female  Other Topics Concern  . Not on file  Social History Narrative  . Not on file   Social Determinants of Health   Financial Resource Strain: Low Risk   . Difficulty of Paying Living Expenses: Not hard at all  Food Insecurity: No Food Insecurity  . Worried About Programme researcher, broadcasting/film/video in the Last Year: Never true  . Ran Out of Food in the Last Year: Never true  Transportation Needs: No Transportation Needs  . Lack of Transportation (Medical): No  . Lack of Transportation (Non-Medical): No  Physical Activity: Inactive  . Days of Exercise per Week: 0 days  . Minutes of Exercise per Session: 0 min  Stress: No Stress Concern Present  . Feeling of Stress : Not at all  Social Connections: Slightly  Isolated  . Frequency of Communication with Friends and Family: Twice a week  . Frequency of Social Gatherings with Friends and Family: Twice a week  . Attends Religious Services: Never  . Active Member of Clubs or Organizations: Yes  . Attends Banker Meetings: More than 4 times per year  . Marital Status: Married  Catering manager Violence: Not At Risk  . Fear of Current or Ex-Partner: No  . Emotionally Abused: No  . Physically Abused: No  . Sexually Abused: No     Current Outpatient Medications:  .  atorvastatin (LIPITOR) 80 MG tablet, Take 1 tablet (80 mg total) by mouth daily at 6 PM., Disp: 90 tablet, Rfl: 1 .  clopidogrel (PLAVIX) 75 MG tablet, Take 1 tablet (75 mg total) by mouth daily., Disp: 90 tablet, Rfl: 1 .  lisinopril (ZESTRIL) 10 MG tablet, Take 1 tablet (10 mg total) by mouth daily., Disp: 90 tablet, Rfl: 1 .  sildenafil (VIAGRA) 100 MG tablet, Take 1 tablet (100 mg total) by mouth as needed for erectile dysfunction., Disp: 30 tablet, Rfl: 1 .  famotidine (PEPCID) 40 MG tablet, Take 1 tablet (40 mg total) by mouth at bedtime., Disp: 90 tablet, Rfl: 1  Allergies  Allergen Reactions  . Penicillins Hives     ROS  Constitutional: Negative for fever or weight change.  Respiratory: Negative for cough but has  shortness of breath with activity    Cardiovascular: Negative  for chest pain or palpitations.  Gastrointestinal: Negative for abdominal pain, no bowel changes.  Musculoskeletal: Negative for gait problem or joint swelling.  Skin: Negative for rash.  Neurological: Negative for dizziness or headache.  No other specific complaints in a complete review of systems (except as listed in HPI above).  Objective  Vitals:   12/14/19 1100  BP: 110/90  Pulse: 81  Resp: 16  Temp: (!) 97.3 F (36.3 C)  TempSrc: Temporal  SpO2: 97%  Weight: 159 lb 11.2 oz (72.4 kg)  Height: 5' 9.25" (1.759 m)    Body mass index is 23.41 kg/m.  Physical  Exam  Constitutional: Patient appears well-developed and well-nourished. No distress.  HENT: Head: Normocephalic and atraumatic. Ears: B TMs ok, no erythema or effusion; Nose:not done Mouth/Throat: not done Eyes: Conjunctivae and EOM are normal. Pupils are equal, round, and reactive to light. No scleral icterus.  Neck: Normal range of motion. Neck supple. No JVD present. No thyromegaly present.  Cardiovascular: Normal rate, regular rhythm and normal heart sounds.  No murmur heard. No BLE edema. Pulmonary/Chest: Effort normal and breath sounds normal. No respiratory distress. Abdominal: Soft. Bowel sounds are normal, no distension. There is no tenderness. no masses MALE GENITALIA: Normal descended testes bilaterally, no masses palpated, no hernias, no lesions, no discharge, inguinal hernia bilaterally left worse than right  RECTAL: Prostate is enlarged, normal consistency  Musculoskeletal: Normal range of motion, no joint effusions. No gross deformities Neurological: he is alert and oriented to person, place, and time. No cranial nerve deficit. Coordination, balance, strength, speech and gait are normal.  Skin: Skin is warm and dry. Senile purpura on both arms., thick toenails  Psychiatric: Patient has a normal mood and affect. behavior is normal. Judgment and thought content normal.  PHQ2/9: Depression screen [facility] 2/9 12/14/2019 11/10/2018 03/24/2018 09/23/2017 11/26/2016  Decreased Interest 0 0 0 0 0  Down, Depressed, Hopeless 0 0 0 0 0  PHQ - 2 Score 0 0 0 0 0  Altered sleeping 0 0 0 - -  Tired, decreased energy 0 0 0 - -  Change in appetite 0 0 0 - -  Feeling bad or failure about yourself  0 0 0 - -  Trouble concentrating 0 1 0 - -  Moving slowly or fidgety/restless 0 0 0 - -  Suicidal thoughts 0 0 0 - -  PHQ-9 Score 0 1 0 - -  Difficult doing work/chores - Not difficult at all Not difficult at all - -     Fall Risk: Fall Risk  12/14/2019 11/10/2018 03/24/2018 09/23/2017 11/26/2016  Falls in  the past year? 0 0 No No No  Number falls in past yr: 0 0 - - -  Injury with Fall? 0 0 - - -     Functional Status Survey: Is the patient deaf or have difficulty hearing?: No Does the patient have difficulty seeing, even when wearing glasses/contacts?: No Does the patient have difficulty concentrating, remembering, or making decisions?: Yes Does the patient have difficulty walking or climbing stairs?: No Does the patient have difficulty dressing or bathing?: No Does the patient have difficulty doing errands alone such as visiting a doctor's office or shopping?: No    Assessment & Plan  1. Pure hypercholesterolemia  - Lipid panel - atorvastatin (LIPITOR) 80 MG tablet; Take 1 tablet (80 mg total) by mouth daily at 6 PM.  Dispense: 90 tablet; Refill: 1  2. Senile purpura (HCC)  - CBC with Differential/Platelet  3.  History of alcoholism ([place])   4. Mild aortic regurgitation   5. Hx of transient ischemic attack (TIA)  - Ambulatory referral to Cardiology - Lipid panel - atorvastatin (LIPITOR) 80 MG tablet; Take 1 tablet (80 mg total) by mouth daily at 6 PM.  Dispense: 90 tablet; Refill: 1 - clopidogrel (PLAVIX) 75 MG tablet; Take 1 tablet (75 mg total) by mouth daily.  Dispense: 90 tablet; Refill: 1  6. Hypertension, benign  - CBC with Differential/Platelet - COMPLETE METABOLIC PANEL WITH GFR - lisinopril (ZESTRIL) 10 MG tablet; Take 1 tablet (10 mg total) by mouth daily.  Dispense: 90 tablet; Refill: 1  7. Gastroesophageal reflux disease without esophagitis  - famotidine (PEPCID) 40 MG tablet; Take 1 tablet (40 mg total) by mouth at bedtime.  Dispense: 90 tablet; Refill: 1  8. Encounter for routine history and physical exam for male   Needs second shingrix but will have covid vaccine tomorrow  9. Other male erectile dysfunction  - sildenafil (VIAGRA) 100 MG tablet; Take 1 tablet (100 mg total) by mouth as needed for erectile dysfunction.  Dispense: 30 tablet;  Refill: 1  10. Prostate cancer screening  - PSA  11. BPH associated with nocturia  - PSA  12. Dyspnea on exertion  - Ambulatory referral to Cardiology  13. Diabetes mellitus screening  - Hemoglobin A1c  14. Nail problem  - Ambulatory referral to Podiatry  -Prostate cancer screening and PSA options (with potential risks and benefits of testing vs not testing) were discussed along with recent recs/guidelines. -USPSTF grade A and B recommendations reviewed with patient; age-appropriate recommendations, preventive care, screening tests, etc discussed and encouraged; healthy living encouraged; see AVS for patient education given to patient -Discussed importance of 150 minutes of physical activity weekly, eat two servings of fish weekly, eat one serving of tree nuts ( cashews, pistachios, pecans, almonds.Marland Kitchen) every other day, eat 6 servings of fruit/vegetables daily and drink plenty of water and avoid sweet beverages.

## 2019-12-15 ENCOUNTER — Ambulatory Visit: Payer: BC Managed Care – PPO | Attending: Internal Medicine

## 2019-12-15 DIAGNOSIS — Z23 Encounter for immunization: Secondary | ICD-10-CM

## 2019-12-15 LAB — CBC WITH DIFFERENTIAL/PLATELET
Absolute Monocytes: 704 cells/uL (ref 200–950)
Basophils Absolute: 47 cells/uL (ref 0–200)
Basophils Relative: 0.7 %
Eosinophils Absolute: 141 cells/uL (ref 15–500)
Eosinophils Relative: 2.1 %
HCT: 47.4 % (ref 38.5–50.0)
Hemoglobin: 15.6 g/dL (ref 13.2–17.1)
Lymphs Abs: 1843 cells/uL (ref 850–3900)
MCH: 29 pg (ref 27.0–33.0)
MCHC: 32.9 g/dL (ref 32.0–36.0)
MCV: 88.1 fL (ref 80.0–100.0)
MPV: 9.4 fL (ref 7.5–12.5)
Monocytes Relative: 10.5 %
Neutro Abs: 3966 cells/uL (ref 1500–7800)
Neutrophils Relative %: 59.2 %
Platelets: 280 10*3/uL (ref 140–400)
RBC: 5.38 10*6/uL (ref 4.20–5.80)
RDW: 13.1 % (ref 11.0–15.0)
Total Lymphocyte: 27.5 %
WBC: 6.7 10*3/uL (ref 3.8–10.8)

## 2019-12-15 LAB — COMPLETE METABOLIC PANEL WITH GFR
AG Ratio: 1.6 (calc) (ref 1.0–2.5)
ALT: 24 U/L (ref 9–46)
AST: 23 U/L (ref 10–35)
Albumin: 4.5 g/dL (ref 3.6–5.1)
Alkaline phosphatase (APISO): 62 U/L (ref 35–144)
BUN: 13 mg/dL (ref 7–25)
CO2: 26 mmol/L (ref 20–32)
Calcium: 9.9 mg/dL (ref 8.6–10.3)
Chloride: 98 mmol/L (ref 98–110)
Creat: 0.83 mg/dL (ref 0.70–1.25)
GFR, Est African American: 109 mL/min/{1.73_m2} (ref >=60)
GFR, Est Non African American: 94 mL/min/{1.73_m2} (ref >=60)
Globulin: 2.8 g/dL (calc) (ref 1.9–3.7)
Glucose, Bld: 84 mg/dL (ref 65–99)
Potassium: 4.8 mmol/L (ref 3.5–5.3)
Sodium: 133 mmol/L — ABNORMAL LOW (ref 135–146)
Total Bilirubin: 0.7 mg/dL (ref 0.2–1.2)
Total Protein: 7.3 g/dL (ref 6.1–8.1)

## 2019-12-15 LAB — LIPID PANEL
Cholesterol: 236 mg/dL — ABNORMAL HIGH (ref <200)
HDL: 75 mg/dL (ref >=40)
LDL Cholesterol (Calc): 138 mg/dL (calc) — ABNORMAL HIGH
Non-HDL Cholesterol (Calc): 161 mg/dL (calc) — ABNORMAL HIGH (ref <130)
Total CHOL/HDL Ratio: 3.1 (calc) (ref <5.0)
Triglycerides: 111 mg/dL (ref <150)

## 2019-12-15 LAB — HEMOGLOBIN A1C
Hgb A1c MFr Bld: 5.1 % of total Hgb (ref <5.7)
Mean Plasma Glucose: 100 (calc)
eAG (mmol/L): 5.5 (calc)

## 2019-12-15 LAB — PSA: PSA: 1.6 ng/mL (ref <=4.0)

## 2019-12-15 NOTE — Progress Notes (Signed)
   Covid-19 Vaccination Clinic  Name:  Raymond Chambers    MRN: [id number] DOB: February 15, 1956  12/15/2019  Mr. Raymond Chambers was observed post Covid-19 immunization for 15 minutes without incident. He was provided with Vaccine Information Sheet and instruction to access the V-Safe system.   Mr. Raymond Chambers was instructed to call 911 with any severe reactions post vaccine: Marland Kitchen Difficulty breathing  . Swelling of face and throat  . A fast heartbeat  . A bad rash all over body  . Dizziness and weakness   Immunizations Administered    Name Date Dose VIS Date Route   Pfizer COVID-19 Vaccine 12/15/2019  9:52 AM 0.3 mL 10/03/2018 Intramuscular   Manufacturer: [employer], [employer]   Lot: [id number]   NDC: [address]-0

## 2019-12-28 ENCOUNTER — Encounter: Payer: Self-pay | Admitting: Podiatry

## 2019-12-28 ENCOUNTER — Ambulatory Visit: Payer: BC Managed Care – PPO | Admitting: Podiatry

## 2019-12-28 ENCOUNTER — Other Ambulatory Visit: Payer: Self-pay

## 2019-12-28 VITALS — BP 132/84 | Temp 97.3°F

## 2019-12-28 DIAGNOSIS — B351 Tinea unguium: Secondary | ICD-10-CM

## 2019-12-28 MED ORDER — TERBINAFINE HCL 250 MG PO TABS: 250.0000 mg | ORAL_TABLET | Freq: Every day | ORAL | 0 refills | Status: DC

## 2019-12-31 NOTE — Progress Notes (Signed)
   Subjective: 64 y.o. male presenting today as a new patient with a chief complaint of thickening and discoloration of nails 1-5 bilaterally that began about one year ago. He denies any pain or modifying factors. He has been trimming the nails himself with no significant change in appearance. Patient is here for further evaluation and treatment.  Past Medical History:  Diagnosis Date  . Abnormal CXR (chest x-ray)    Repeat CXR in 6 months  . Alcohol abuse, in remission   . Bilateral inguinal hernia   . Cyst of finger   . Depression   . GERD (gastroesophageal reflux disease)   . Herniated lumbar intervertebral disc    L-5  . History of attention deficit disorder   . History of skin cancer   . Hyperlipidemia   . Stroke [facility])    TIA     Objective: Physical Exam General: The patient is alert and oriented x3 in no acute distress.  Dermatology: Hyperkeratotic, discolored, thickened, onychodystrophy noted to nails 1-5 bilaterally. Skin is warm, dry and supple bilateral lower extremities. Negative for open lesions or macerations.  Vascular: Palpable pedal pulses bilaterally. No edema or erythema noted. Capillary refill within normal limits.  Neurological: Epicritic and protective threshold grossly intact bilaterally.   Musculoskeletal Exam: Range of motion within normal limits to all pedal and ankle joints bilateral. Muscle strength 5/5 in all groups bilateral.   Assessment: #1 Onychomycosis nails 1-5 bilaterally  #2 Hyperkeratotic nails 1-5 bilaterally   Plan of Care:  #1 Patient was evaluated. #2 Prescription for Lamisil 250 mg #90 provided to patient.  #3 Inquired about liver function and patient declines any known history of issue or problems.  #4 Return to clinic in 6 months.   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    Felecia Shelling, DPM [facility]  Dr. Felecia Shelling, DPM    [address]                                         [place], [place] [address]                Office [phone]  Fax [phone]

## 2020-01-08 ENCOUNTER — Ambulatory Visit: Payer: BC Managed Care – PPO | Attending: Internal Medicine

## 2020-01-08 DIAGNOSIS — Z23 Encounter for immunization: Secondary | ICD-10-CM

## 2020-01-08 NOTE — Progress Notes (Signed)
   Covid-19 Vaccination Clinic  Name:  Raymond Chambers    MRN: [id number] DOB: [birth year]  01/08/2020  Mr. Fero was observed post Covid-19 immunization for 15 minutes without incident. He was provided with Vaccine Information Sheet and instruction to access the V-Safe system.   Mr. Bramer was instructed to call 911 with any severe reactions post vaccine: Marland Kitchen Difficulty breathing  . Swelling of face and throat  . A fast heartbeat  . A bad rash all over body  . Dizziness and weakness   Immunizations Administered    Name Date Dose VIS Date Route   Pfizer COVID-19 Vaccine 01/08/2020 10:09 AM 0.3 mL 10/03/2018 Intramuscular   Manufacturer: [employer], [employer]   Lot: [id number]   NDC: [address]-9

## 2020-02-15 ENCOUNTER — Ambulatory Visit: Payer: BC Managed Care – PPO

## 2020-02-20 ENCOUNTER — Ambulatory Visit: Payer: BC Managed Care – PPO | Admitting: Internal Medicine

## 2020-02-22 ENCOUNTER — Ambulatory Visit (INDEPENDENT_AMBULATORY_CARE_PROVIDER_SITE_OTHER): Payer: BC Managed Care – PPO

## 2020-02-22 ENCOUNTER — Other Ambulatory Visit: Payer: Self-pay

## 2020-02-22 DIAGNOSIS — Z23 Encounter for immunization: Secondary | ICD-10-CM | POA: Diagnosis not present

## 2020-05-09 ENCOUNTER — Encounter: Payer: Self-pay | Admitting: Internal Medicine

## 2020-05-09 ENCOUNTER — Other Ambulatory Visit: Payer: Self-pay

## 2020-05-09 ENCOUNTER — Ambulatory Visit (INDEPENDENT_AMBULATORY_CARE_PROVIDER_SITE_OTHER): Payer: BC Managed Care – PPO | Admitting: Internal Medicine

## 2020-05-09 VITALS — BP 130/80 | HR 70 | Ht 71.0 in | Wt 171.0 lb

## 2020-05-09 DIAGNOSIS — Z8774 Personal history of (corrected) congenital malformations of heart and circulatory system: Secondary | ICD-10-CM

## 2020-05-09 DIAGNOSIS — Z8673 Personal history of transient ischemic attack (TIA), and cerebral infarction without residual deficits: Secondary | ICD-10-CM

## 2020-05-09 DIAGNOSIS — I77819 Aortic ectasia, unspecified site: Secondary | ICD-10-CM

## 2020-05-09 DIAGNOSIS — E785 Hyperlipidemia, unspecified: Secondary | ICD-10-CM

## 2020-05-09 DIAGNOSIS — I10 Essential (primary) hypertension: Secondary | ICD-10-CM

## 2020-05-09 MED ORDER — EZETIMIBE 10 MG PO TABS: 10.0000 mg | ORAL_TABLET | Freq: Every day | ORAL | 3 refills | Status: DC

## 2020-05-09 NOTE — Progress Notes (Signed)
OFFICE CONSULT NOTE  Chief Complaint:  Establish cardiologist  Primary Care Physician: Alba Cory, MD  HPI:  Raymond Chambers is a 64 y.o. male who is being seen today for the evaluation of abnormal echo at the request of Alba Cory, MD.  This is a pleasant 64 year old male whose wife is a patient of mine.  He has a history of TIA in 2016.  At that time he underwent an echocardiogram which demonstrated a normal LVEF of 55 to 60%, diastolic dysfunction and he was also noted to have a bicuspid aortic valve without stenosis and mild aortic insufficiency.  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  He reports since then he has done well.  He was placed on Plavix.  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  He works as an Retail banker.  He says he is asymptomatic denies any shortness of breath, chest pain, presyncope or syncopal symptoms.  He denies any palpitations.  Unfortunately, he has not had follow-up of his dilated aorta or bicuspid valve since his prior echo in 2016.  He is here to establish cardiac care.  He also has a history of dyslipidemia and more recently his lipid profile showed total cholesterol 236, HDL 75, triglycerides 111 and LDL of 138 on 80 mg of atorvastatin, suggesting that his dyslipidemia is likely significant and possibly familial.  There is a history of heart disease in his father.  PMHx:  Past Medical History:  Diagnosis Date  . Abnormal CXR (chest x-ray)    Repeat CXR in 6 months  . Alcohol abuse, in remission   . Bilateral inguinal hernia   . Cyst of finger   . Depression   . GERD (gastroesophageal reflux disease)   . Herniated lumbar intervertebral disc    L-5  . History of attention deficit disorder   . History of skin cancer   . Hyperlipidemia   . Stroke [facility])    TIA     Past Surgical History:  Procedure Laterality Date  . COLONOSCOPY WITH PROPOFOL N/A 03/29/2017   Procedure:  COLONOSCOPY WITH PROPOFOL;  Surgeon: Midge Minium, MD;  Location: [facility] ENDOSCOPY;  Service: Endoscopy;  Laterality: N/A;  . KNEE SURGERY Left 2005    FAMHx:  Family History  Problem Relation Age of Onset  . Cancer Father        Stomach   . Heart disease Father   . Diabetes Maternal Grandfather   . Alcohol abuse Paternal Uncle   . Obesity Brother     SOCHx:   reports that he quit smoking about 6 years ago. His smoking use included pipe. He started smoking about 16 years ago. He quit after 10.00 years of use. He has never used smokeless tobacco. He reports current alcohol use of about 1.0 standard drink of alcohol per week. He reports that he does not use drugs.  ALLERGIES:  Allergies  Allergen Reactions  . Penicillins Hives    ROS: Pertinent items noted in HPI and remainder of comprehensive ROS otherwise negative.  HOME MEDS: Current Outpatient Medications on File Prior to Visit  Medication Sig Dispense Refill  . atorvastatin (LIPITOR) 80 MG tablet Take 1 tablet (80 mg total) by mouth daily at 6 PM. 90 tablet 1  . clopidogrel (PLAVIX) 75 MG tablet Take 1 tablet (75 mg total) by mouth daily. 90 tablet 1  . famotidine (PEPCID) 40 MG tablet  Take 1 tablet (40 mg total) by mouth at bedtime. 90 tablet 1  . lisinopril (ZESTRIL) 10 MG tablet Take 1 tablet (10 mg total) by mouth daily. 90 tablet 1  . sildenafil (VIAGRA) 100 MG tablet Take 1 tablet (100 mg total) by mouth as needed for erectile dysfunction. 30 tablet 1  . terbinafine (LAMISIL) 250 MG tablet Take 1 tablet (250 mg total) by mouth daily. (Patient not taking: Reported on 05/09/2020) 90 tablet 0   No current facility-administered medications on file prior to visit.    LABS/IMAGING: No results found for this or any previous visit (from the past 48 hour(s)). No results found.  LIPID PANEL:    Component Value Date/Time   CHOL 236 (H) 12/14/2019 1205   TRIG 111 12/14/2019 1205   HDL 75 12/14/2019 1205   CHOLHDL 3.1  12/14/2019 1205   VLDL 11 06/03/2016 0903   LDLCALC 138 (H) 12/14/2019 1205    WEIGHTS: Wt Readings from Last 3 Encounters:  05/09/20 171 lb (77.6 kg)  12/14/19 159 lb 11.2 oz (72.4 kg)  11/10/18 158 lb (71.7 kg)    VITALS: BP 130/80   Pulse 70   Ht 5\' 11"  (1.803 m)   Wt 171 lb (77.6 kg)   SpO2 96%   BMI 23.85 kg/m   EXAM: General appearance: alert, no distress and Thin Neck: no carotid bruit, no JVD and thyroid not enlarged, symmetric, no tenderness/mass/nodules Lungs: clear to auscultation bilaterally Heart: regular rate and rhythm, S1, S2 normal and diastolic murmur: early diastolic 2/6, blowing at apex Abdomen: soft, non-tender; bowel sounds normal; no masses,  no organomegaly and No pulsatile mass Extremities: extremities normal, atraumatic, no cyanosis or edema Pulses: 2+ and symmetric Skin: Skin color, texture, turgor normal. No rashes or lesions Neurologic: Grossly normal Psych: Pleasant  EKG: Normal sinus rhythm at 70- personally reviewed  ASSESSMENT: 1. History of TIA (2016) 2. Reported bicuspid aortic valve with aortic root dilatation to 4.4 cm (04/2015), LVEF 55 to 60% 3. Dyslipidemia, goal LDL less than 70 4. History of heavy alcohol use-in remission 5. Hypertension  PLAN: 1.   Mr. Willhoite has a history of TIA but no recurrent events since placed on clopidogrel in 2016.  He was found to have a bicuspid aortic valve at that time with aortic root dilatation however no evidence of stenosis in today I do not appreciate a loud systolic murmur.  There may be a soft diastolic murmur.  I would like to repeat an echo as his exam findings are inconsistent with his previously reported bicuspid valve.  He was also noted to have aortic root dilatation and the echo will be helpful to further clarify that.  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  Given his history of TIA, his target LDL is less than 70 and he is far from that on his  high potency statin.  Will add ezetimibe 10 mg daily to his current regimen and repeat lipids in about 3 months.  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  Plan follow-up with me in about 3 months.  Thanks again for the kind referral.  2017, MD, [facility]  [place]  [facility]  Medical Director of the [facility] Lipid Disorders &  Cardiovascular Risk Reduction Clinic Diplomate of the American Board of Clinical Lipidology Attending Cardiologist  Direct Dial: [phone]  Fax:  [phone]  Website:  [email or web address] Nicely Tyonna Talerico 05/09/2020, 3:58 PM

## 2020-05-09 NOTE — Patient Instructions (Signed)
Medication Instructions:  Your physician has recommended you make the following change in your medication: START zetia 10mg  daily Continue all other current medications  *If you need a refill on your cardiac medications before your next appointment, please call your pharmacy*   Lab Work: FASTING lipid panel in 3 months to check cholesterol   If you have labs (blood work) drawn today and your tests are completely normal, you will receive your results only by: MyChart Message (if you have MyChart) OR . A paper copy in the mail If you have any lab test that is abnormal or we need to change your treatment, we will call you to review the results.   Testing/Procedures: Your physician has requested that you have an echocardiogram. Echocardiography is a painless test that uses sound waves to create images of your heart. It provides your doctor with information about the size and shape of your heart and how well your heart's chambers and valves are working. This procedure takes approximately one hour. There are no restrictions for this procedure. -- [address]   Follow-Up: At [facility], you and your health needs are our priority.  As part of our continuing mission to provide you with exceptional heart care, we have created designated Provider Care Teams.  These Care Teams include your primary Cardiologist (physician) and Advanced Practice Providers (APPs -  Physician Assistants and Nurse Practitioners) who all work together to provide you with the care you need, when you need it.  We recommend signing up for the patient portal called "MyChart".  Sign up information is provided on this After Visit Summary.  MyChart is used to connect with patients for Virtual Visits (Telemedicine).  Patients are able to view lab/test results, encounter notes, upcoming appointments, etc.  Non-urgent messages can be sent to your provider as well.   To learn more about what you can do with  MyChart, go to [employer].    Your next appointment:   3 month(s)  The format for your next appointment:   In Person  Provider:   You may see Dr. [email or web address] or one of the following Advanced Practice Providers on your designated Care Team:    Rennis Golden, PA-C  Azalee Course, Micah Flesher or   [place], Judy Pimple    Other Instructions

## 2020-05-12 ENCOUNTER — Ambulatory Visit (HOSPITAL_COMMUNITY): Payer: BC Managed Care – PPO | Attending: Cardiovascular Disease

## 2020-05-12 ENCOUNTER — Other Ambulatory Visit: Payer: Self-pay

## 2020-05-12 DIAGNOSIS — Z8673 Personal history of transient ischemic attack (TIA), and cerebral infarction without residual deficits: Secondary | ICD-10-CM

## 2020-05-12 DIAGNOSIS — I77819 Aortic ectasia, unspecified site: Secondary | ICD-10-CM | POA: Diagnosis not present

## 2020-05-12 DIAGNOSIS — Z8774 Personal history of (corrected) congenital malformations of heart and circulatory system: Secondary | ICD-10-CM

## 2020-05-12 LAB — ECHOCARDIOGRAM COMPLETE
Area-P 1/2: 3.95 cm2
P 1/2 time: 480 msec
S' Lateral: 2.6 cm

## 2020-05-15 ENCOUNTER — Telehealth: Payer: Self-pay | Admitting: Internal Medicine

## 2020-05-15 NOTE — Telephone Encounter (Signed)
Patient is requesting to have lipid lab order faxed to [facility] in [place], [place]. Please assist.

## 2020-05-15 NOTE — Telephone Encounter (Signed)
Patient notified that [facility] can access whatever labs have been ordered for him and FLP is not due until jan 2022, prior to next visit  Reviewed echo results with him also

## 2020-05-26 ENCOUNTER — Other Ambulatory Visit: Payer: Self-pay | Admitting: *Deleted

## 2020-05-26 DIAGNOSIS — Z01812 Encounter for preprocedural laboratory examination: Secondary | ICD-10-CM

## 2020-05-26 DIAGNOSIS — I77819 Aortic ectasia, unspecified site: Secondary | ICD-10-CM

## 2020-06-04 ENCOUNTER — Other Ambulatory Visit: Payer: BC Managed Care – PPO

## 2020-06-17 NOTE — Progress Notes (Deleted)
Name: Raymond Chambers   MRN: [id number]    DOB: September 07, 1955   Date:06/17/2020       Progress Note  Subjective  Chief Complaint  Follow up   HPI Hyperlipidemia: taking Atorvastatin as prescribed, he states leg cramps have improved   HTN:his bp is a little hight today but he has been out of medication for the past week. He denies chest pain or palpitation. Usually in the 120's range   TIA: he was at work on 09/2016and developed acute onset of dizziness. Followed by left facial tingling, left leg and arm tingling and heaviness, the episode lasted about 2 minutes . He went to [facility] and transferred to [facility], he had multiple tests done, abnormal findings ( stable mild apical pleural thickening and bicuspid Aortic Valve, increase in in subcortical hyper intensities slightly greater than expected for age) and elevated LDL. He has not been compliant with Atorvastatin and Plavix. He is willing to see cardiologist and neurologist   SOB with activity: he can only due the elliptical for 3 minutes.   History of alcoholism: used to be a heavy drinker, currently only drinks one glass of wine with wife a few times a week.   ED: he is able to have an erection but unable to maintain, doing well on viagra   GERD: he states worse symptoms at night, waking up during the night with substernal burning. Discussed not eating for 2-3 hours before bed, discussed GERD appropriate diet and we will try pepcid for now   *** Patient Active Problem List   Diagnosis Date Noted  . BPH associated with nocturia 03/24/2018  . Colon cancer screening   . Rectal polyp   . Mild aortic regurgitation 11/26/2016  . History of alcoholism (HCC) 04/17/2015  . 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 Acid reflux 04/17/2015  . Herniation of nucleus pulposus 04/17/2015  . Bilateral inguinal hernia 04/17/2015  . H/O malignant neoplasm of skin 04/17/2015  . Bicuspid aortic valve 04/17/2015  . HLD  (hyperlipidemia)   . Hx of transient ischemic attack (TIA) 04/16/2015    Past Surgical History:  Procedure Laterality Date  . COLONOSCOPY WITH PROPOFOL N/A 03/29/2017   Procedure: COLONOSCOPY WITH PROPOFOL;  Surgeon: Midge Minium, MD;  Location: [facility] ENDOSCOPY;  Service: Endoscopy;  Laterality: N/A;  . KNEE SURGERY Left 2005    Family History  Problem Relation Age of Onset  . Cancer Father        Stomach   . Heart disease Father   . Diabetes Maternal Grandfather   . Alcohol abuse Paternal Uncle   . Obesity Brother     Social History   Tobacco Use  . Smoking status: Former Smoker    Years: 10.00    Types: Pipe    Start date: 08/10/2003    Quit date: 08/09/2013    Years since quitting: 6.8  . Smokeless tobacco: Never Used  Substance Use Topics  . Alcohol use: Yes    Alcohol/week: 1.0 standard drink    Types: 1 Glasses of wine per week    Comment: wine with dinner very occasionally     Current Outpatient Medications:  .  atorvastatin (LIPITOR) 80 MG tablet, Take 1 tablet (80 mg total) by mouth daily at 6 PM., Disp: 90 tablet, Rfl: 1 .  clopidogrel (PLAVIX) 75 MG tablet, Take 1 tablet (75 mg total) by mouth daily., Disp: 90 tablet, Rfl: 1 .  ezetimibe (ZETIA) 10 MG tablet, Take 1  tablet (10 mg total) by mouth daily., Disp: 90 tablet, Rfl: 3 .  famotidine (PEPCID) 40 MG tablet, Take 1 tablet (40 mg total) by mouth at bedtime., Disp: 90 tablet, Rfl: 1 .  lisinopril (ZESTRIL) 10 MG tablet, Take 1 tablet (10 mg total) by mouth daily., Disp: 90 tablet, Rfl: 1 .  sildenafil (VIAGRA) 100 MG tablet, Take 1 tablet (100 mg total) by mouth as needed for erectile dysfunction., Disp: 30 tablet, Rfl: 1 .  terbinafine (LAMISIL) 250 MG tablet, Take 1 tablet (250 mg total) by mouth daily. (Patient not taking: Reported on 05/09/2020), Disp: 90 tablet, Rfl: 0  Allergies  Allergen Reactions  . Penicillins Hives    I personally reviewed {Reviewed:14835} with the patient/caregiver  today.   ROS  ***  Objective  There were no vitals filed for this visit.  There is no height or weight on file to calculate BMI.  Physical Exam ***  Recent Results (from the past 2160 hour(s))  ECHOCARDIOGRAM COMPLETE     Status: None   Collection Time: 05/12/20  4:35 PM  Result Value Ref Range   Area-P 1/2 3.95 cm2   S' Lateral 2.60 cm   P 1/2 time 480 msec    Diabetic Foot Exam: Diabetic Foot Exam - Simple   No data filed     ***  PHQ2/9: Depression screen [facility] 2/9 12/14/2019 11/10/2018 03/24/2018 09/23/2017 11/26/2016  Decreased Interest 0 0 0 0 0  Down, Depressed, Hopeless 0 0 0 0 0  PHQ - 2 Score 0 0 0 0 0  Altered sleeping 0 0 0 - -  Tired, decreased energy 0 0 0 - -  Change in appetite 0 0 0 - -  Feeling bad or failure about yourself  0 0 0 - -  Trouble concentrating 0 1 0 - -  Moving slowly or fidgety/restless 0 0 0 - -  Suicidal thoughts 0 0 0 - -  PHQ-9 Score 0 1 0 - -  Difficult doing work/chores - Not difficult at all Not difficult at all - -    phq 9 is {gen pos WRU:045409} ***  Fall Risk: Fall Risk  12/14/2019 11/10/2018 03/24/2018 09/23/2017 11/26/2016  Falls in the past year? 0 0 No No No  Number falls in past yr: 0 0 - - -  Injury with Fall? 0 0 - - -   ***   Functional Status Survey:   ***   Assessment & Plan  *** There are no diagnoses linked to this encounter.

## 2020-06-20 ENCOUNTER — Ambulatory Visit: Payer: BC Managed Care – PPO | Admitting: Family Medicine

## 2020-07-01 ENCOUNTER — Encounter: Payer: BC Managed Care – PPO | Admitting: Podiatry

## 2020-07-11 ENCOUNTER — Encounter: Payer: BC Managed Care – PPO | Admitting: Podiatry

## 2020-07-18 ENCOUNTER — Ambulatory Visit: Payer: BC Managed Care – PPO | Admitting: Podiatry

## 2020-07-18 ENCOUNTER — Other Ambulatory Visit: Payer: Self-pay

## 2020-07-18 ENCOUNTER — Encounter: Payer: Self-pay | Admitting: Podiatry

## 2020-07-18 DIAGNOSIS — B351 Tinea unguium: Secondary | ICD-10-CM

## 2020-07-18 NOTE — Progress Notes (Signed)
   Subjective: 64 y.o. male presenting today for follow-up evaluation of onychomycosis to the bilateral toenails.  Patient states there has been significant improvement over the last 6 months.  Past Medical History:  Diagnosis Date  . Abnormal CXR (chest x-ray)    Repeat CXR in 6 months  . Alcohol abuse, in remission   . Bilateral inguinal hernia   . Cyst of finger   . Depression   . GERD (gastroesophageal reflux disease)   . Herniated lumbar intervertebral disc    L-5  . History of attention deficit disorder   . History of skin cancer   . Hyperlipidemia   . Stroke [facility])    TIA     Objective: Physical Exam General: The patient is alert and oriented x3 in no acute distress.  Dermatology: Hyperkeratotic, discolored, thickened, onychodystrophy noted to nails 1-5 bilaterally only noted on the distal portions of the nail.  Overall there is significant improvement.. Skin is warm, dry and supple bilateral lower extremities. Negative for open lesions or macerations.  Vascular: Palpable pedal pulses bilaterally. No edema or erythema noted. Capillary refill within normal limits.  Neurological: Epicritic and protective threshold grossly intact bilaterally.   Musculoskeletal Exam: Range of motion within normal limits to all pedal and ankle joints bilateral. Muscle strength 5/5 in all groups bilateral.   Assessment: #1 Onychomycosis nails 1-5 bilaterally  #2 Hyperkeratotic nails 1-5 bilaterally   Plan of Care:  #1 Patient was evaluated. #2  Explained that the remaining fungal portion of the nail should grow out with time.  Overall significant improvement. 3.  Continue good foot hygiene 4.  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    Felecia Shelling, DPM [facility]  Dr. Felecia Shelling, DPM    [address]                                        [place], [place] [address]                Office [phone]  Fax [phone]

## 2020-08-15 ENCOUNTER — Ambulatory Visit: Payer: BC Managed Care – PPO | Admitting: Family Medicine

## 2020-08-18 DIAGNOSIS — E785 Hyperlipidemia, unspecified: Secondary | ICD-10-CM | POA: Diagnosis not present

## 2020-08-19 LAB — LIPID PANEL
Chol/HDL Ratio: 2.1 ratio (ref 0.0–5.0)
Cholesterol, Total: 141 mg/dL (ref 100–199)
HDL: 66 mg/dL (ref >39)
LDL Chol Calc (NIH): 62 mg/dL (ref 0–99)
Triglycerides: 66 mg/dL (ref 0–149)
VLDL Cholesterol Cal: 13 mg/dL (ref 5–40)

## 2020-08-20 ENCOUNTER — Encounter: Payer: Self-pay | Admitting: Internal Medicine

## 2020-08-20 ENCOUNTER — Telehealth (INDEPENDENT_AMBULATORY_CARE_PROVIDER_SITE_OTHER): Payer: BC Managed Care – PPO | Admitting: Internal Medicine

## 2020-08-20 VITALS — Ht 71.0 in | Wt 174.0 lb

## 2020-08-20 DIAGNOSIS — E785 Hyperlipidemia, unspecified: Secondary | ICD-10-CM | POA: Diagnosis not present

## 2020-08-20 DIAGNOSIS — I10 Essential (primary) hypertension: Secondary | ICD-10-CM | POA: Diagnosis not present

## 2020-08-20 DIAGNOSIS — Z8673 Personal history of transient ischemic attack (TIA), and cerebral infarction without residual deficits: Secondary | ICD-10-CM

## 2020-08-20 DIAGNOSIS — I77819 Aortic ectasia, unspecified site: Secondary | ICD-10-CM | POA: Diagnosis not present

## 2020-08-20 DIAGNOSIS — I351 Nonrheumatic aortic (valve) insufficiency: Secondary | ICD-10-CM

## 2020-08-20 NOTE — Patient Instructions (Addendum)
Medication Instructions:  No Changes In Medications at this time.  *If you need a refill on your cardiac medications before your next appointment, please call your pharmacy*  Follow-Up: At [facility], you and your health needs are our priority.  As part of our continuing mission to provide you with exceptional heart care, we have created designated Provider Care Teams.  These Care Teams include your primary Cardiologist (physician) and Advanced Practice Providers (APPs -  Physician Assistants and Nurse Practitioners) who all work together to provide you with the care you need, when you need it.  We recommend signing up for the patient portal called "MyChart".  Sign up information is provided on this After Visit Summary.  MyChart is used to connect with patients for Virtual Visits (Telemedicine).  Patients are able to view lab/test results, encounter notes, upcoming appointments, etc.  Non-urgent messages can be sent to your provider as well.   To learn more about what you can do with MyChart, go to [email or web address].    Your next appointment:   1 year    The format for your next appointment:   In Person  Provider:   K. Chad Hilty, MD  

## 2020-08-20 NOTE — Progress Notes (Signed)
Virtual Visit via Telephone Note   This visit type was conducted due to national recommendations for restrictions regarding the COVID-19 Pandemic (e.g. social distancing) in an effort to limit this patient's exposure and mitigate transmission in our community.  Due to his co-morbid illnesses, this patient is at least at moderate risk for complications without adequate follow up.  This format is felt to be most appropriate for this patient at this time.  The patient did not have access to video technology/had technical difficulties with video requiring transitioning to audio format only (telephone).  All issues noted in this document were discussed and addressed.  No physical exam could be performed with this format.  Please refer to the patient's chart for his  consent to telehealth for [facility].   Date:  08/20/2020   ID:  Raymond Chambers, DOB 1956-06-30, MRN [id number] The patient was identified using 2 identifiers.  Evaluation Performed:  Follow-Up Visit  Patient Location:  [address] [place] [address]  Provider location:   [address] [place], [place] [address]  PCP:  Alba Cory, MD  Cardiologist:  No primary care provider on file. Electrophysiologist:  None   Chief Complaint:  Follow-up echo  History of Present Illness:    Raymond Chambers is a 65 y.o. male who presents via audio/video conferencing for a telehealth visit today.  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  He has a history of TIA in 2016.  At that time he underwent an echocardiogram which demonstrated a normal LVEF of 55 to 60%, diastolic dysfunction and he was also noted to have a bicuspid aortic valve without stenosis and mild aortic insufficiency.  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  He reports since then he has done well.  He was placed on Plavix.  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  He works as an Retail banker.  He says he is asymptomatic denies any shortness of breath, chest pain, presyncope or syncopal symptoms.  He denies any palpitations.  Unfortunately, he has not had follow-up of his dilated aorta or bicuspid valve since his prior echo in 2016.  He is here to establish cardiac care.  He also has a history of dyslipidemia and more recently his lipid profile showed total cholesterol 236, HDL 75, triglycerides 111 and LDL of 138 on 80 mg of atorvastatin, suggesting that his dyslipidemia is likely significant and possibly familial.  There is a history of heart disease in his father.  08/20/2020  Raymond Chambers is doing well.  He is seen today for follow-up of his echo.  I am pleased to report the echo showed a Tri leaflet aortic valve, previously thought to be bicuspid.  The aortic root is dilated to 4.3 cm however measured 4.4 cm in 2016.  There was moderate aortic insufficiency however he seems to be asymptomatic with this.  We will need to continue to follow this with a repeat echo next year.  Blood pressures well controlled although he was not able to measure it today.  He reports his weight has been going up and he is going to try to make dietary changes and more exercise to combat this.  Recently had recommended and the addition of ezetimibe to his statin in order to help with his dyslipidemia. He has had an excellent response  with total cholesterol now 141, triglycerides 66, HDL 66 and LDL 62 (decreased from 138) in May 2021.  The patient does not have symptoms concerning for COVID-19 infection (fever, chills, cough, or new SHORTNESS OF BREATH).    Prior CV studies:   The following studies were reviewed today:  Chart reviewed  PMHx:  Past Medical History:  Diagnosis Date  . Abnormal CXR (chest x-ray)    Repeat CXR in 6 months  . Alcohol abuse, in remission   . Bilateral inguinal hernia   . Cyst of finger   . Depression   . GERD (gastroesophageal reflux  disease)   . Herniated lumbar intervertebral disc    L-5  . History of attention deficit disorder   . History of skin cancer   . Hyperlipidemia   . Stroke [facility])    TIA     Past Surgical History:  Procedure Laterality Date  . COLONOSCOPY WITH PROPOFOL N/A 03/29/2017   Procedure: COLONOSCOPY WITH PROPOFOL;  Surgeon: Midge Minium, MD;  Location: [facility] ENDOSCOPY;  Service: Endoscopy;  Laterality: N/A;  . KNEE SURGERY Left 2005    FAMHx:  Family History  Problem Relation Age of Onset  . Cancer Father        Stomach   . Heart disease Father   . Diabetes Maternal Grandfather   . Alcohol abuse Paternal Uncle   . Obesity Brother     SOCHx:   reports that he quit smoking about 7 years ago. His smoking use included pipe. He started smoking about 17 years ago. He quit after 10.00 years of use. He has never used smokeless tobacco. He reports current alcohol use of about 1.0 standard drink of alcohol per week. He reports that he does not use drugs.  ALLERGIES:  Allergies  Allergen Reactions  . Penicillins Hives    MEDS:  Current Meds  Medication Sig  . atorvastatin (LIPITOR) 80 MG tablet Take 1 tablet (80 mg total) by mouth daily at 6 PM.  . clopidogrel (PLAVIX) 75 MG tablet Take 1 tablet (75 mg total) by mouth daily.  . famotidine (PEPCID) 40 MG tablet Take 1 tablet (40 mg total) by mouth at bedtime.  Marland Kitchen lisinopril (ZESTRIL) 10 MG tablet Take 1 tablet (10 mg total) by mouth daily.  . sildenafil (VIAGRA) 100 MG tablet Take 1 tablet (100 mg total) by mouth as needed for erectile dysfunction.  . terbinafine (LAMISIL) 250 MG tablet Take 1 tablet (250 mg total) by mouth daily.     ROS: Pertinent items noted in HPI and remainder of comprehensive ROS otherwise negative.  Labs/Other Tests and Data Reviewed:    Recent Labs: 12/14/2019: ALT 24; BUN 13; Creat 0.83; Hemoglobin 15.6; Platelets 280; Potassium 4.8; Sodium 133   Recent Lipid Panel Lab Results  Component Value Date/Time    CHOL 141 08/18/2020 12:16 PM   TRIG 66 08/18/2020 12:16 PM   HDL 66 08/18/2020 12:16 PM   CHOLHDL 2.1 08/18/2020 12:16 PM   CHOLHDL 3.1 12/14/2019 12:05 PM   LDLCALC 62 08/18/2020 12:16 PM   LDLCALC 138 (H) 12/14/2019 12:05 PM    Wt Readings from Last 3 Encounters:  08/20/20 174 lb (78.9 kg)  05/09/20 171 lb (77.6 kg)  12/14/19 159 lb 11.2 oz (72.4 kg)     Exam:    Vital Signs:  Ht 5\' 11"  (1.803 m)   Wt 174 lb (78.9 kg)   BMI 24.27 kg/m    Exam not performed due to telephone visit  ASSESSMENT & PLAN:    1. History of TIA (2016) 2. Reported bicuspid aortic valve with aortic root dilatation to 4.4 cm (04/2015), LVEF 55 to 60% 3. Dyslipidemia, goal LDL less than 70 4. History of heavy alcohol use-in remission 5. Hypertension  Overall Raymond Chambers is doing well.  His aorta appears to be stable.  He has mild to moderate aortic insufficiency and will need a repeat echo in a year to reassess this.  He is at target LDL now less than 70 on combination of statin and ezetimibe.  Blood pressure is reportedly well controlled although was not available today.  Weight has been trending up and he is aware of this.  He is going to work on diet and more physical activity.  We discussed ways to do that today.  Plan follow-up with me annually or sooner as necessary.  COVID-19 Education: The signs and symptoms of COVID-19 were discussed with the patient and how to seek care for testing (follow up with PCP or arrange E-visit).  The importance of social distancing was discussed today.  Patient Risk:   After full review of this patients clinical status, I feel that they are at least moderate risk at this time.  Time:   Today, I have spent 25 minutes with the patient with telehealth technology discussing lipidemia, hypertension, weight loss.     Medication Adjustments/Labs and Tests Ordered: Current medicines are reviewed at length with the patient today.  Concerns regarding medicines are outlined  above.   Tests Ordered: No orders of the defined types were placed in this encounter.   Medication Changes: No orders of the defined types were placed in this encounter.   Disposition:  in 1 year(s)  Chrystie Nose, MD, [facility], FACP  [place]  [facility]  Medical Director of the Advanced Lipid Disorders &  Cardiovascular Risk Reduction Clinic Diplomate of the American Board of Clinical Lipidology Attending Cardiologist  Direct Dial: [phone]  Fax: [phone]  Website:  [email or web address]  Chrystie Nose, MD  08/20/2020 9:29 AM

## 2020-11-01 ENCOUNTER — Other Ambulatory Visit: Payer: Self-pay | Admitting: Family Medicine

## 2020-11-01 DIAGNOSIS — N528 Other male erectile dysfunction: Secondary | ICD-10-CM

## 2020-11-01 NOTE — Telephone Encounter (Signed)
Approved per protocol. No future visit scheduled.  Requested Prescriptions  Pending Prescriptions Disp Refills  . sildenafil (VIAGRA) 100 MG tablet [Pharmacy Med Name: SILDENAFIL 100 MG TABLET] 30 tablet 0    Sig: TAKE ONE TABLET BY MOUTH DAILY AS NEEDED FOR ERECTILE DYSFUNCTION     Urology: Erectile Dysfunction Agents Passed - 11/01/2020  4:41 PM      Passed - Last BP in normal range    BP Readings from Last 1 Encounters:  05/09/20 130/80         Passed - Valid encounter within last 12 months    Recent Outpatient Visits          10 months ago Senile purpura [facility])   [facility] Alba Cory, MD   1 year ago Pure hypercholesterolemia   [facility] Alba Cory, MD   2 years ago Encounter for routine history and physical exam for male   [facility] Alba Cory, MD   3 years ago Pure hypercholesterolemia   [facility] Alba Cory, MD   3 years ago Encounter for routine history and physical exam for male   [facility] Alba Cory, MD

## 2020-12-16 ENCOUNTER — Other Ambulatory Visit: Payer: Self-pay | Admitting: Family Medicine

## 2020-12-16 DIAGNOSIS — Z8673 Personal history of transient ischemic attack (TIA), and cerebral infarction without residual deficits: Secondary | ICD-10-CM

## 2020-12-16 DIAGNOSIS — I10 Essential (primary) hypertension: Secondary | ICD-10-CM

## 2020-12-16 DIAGNOSIS — E7800 Pure hypercholesterolemia, unspecified: Secondary | ICD-10-CM

## 2020-12-16 NOTE — Telephone Encounter (Signed)
Pt has an appt on 12/23/20

## 2020-12-16 NOTE — Telephone Encounter (Signed)
lvm to let pt know that prescription has been sent to pharmacy also to keep scheduled appt

## 2020-12-16 NOTE — Telephone Encounter (Signed)
Notes to clinic: Patient is scheduled for appt on 01/23/2021 Review for enough medication until that time    Requested Prescriptions  Pending Prescriptions Disp Refills   clopidogrel (PLAVIX) 75 MG tablet [Pharmacy Med Name: CLOPIDOGREL 75 MG TAB[*]] 30 tablet 1    Sig: TAKE ONE TABLET BY MOUTH ONE TIME DAILY      Hematology: Antiplatelets - clopidogrel Failed - 12/16/2020  9:27 AM      Failed - Evaluate AST, ALT within 2 months of therapy initiation.      Failed - ALT in normal range and within 360 days    ALT  Date Value Ref Range Status  12/14/2019 24 9 - 46 U/L Final          Failed - AST in normal range and within 360 days    AST  Date Value Ref Range Status  12/14/2019 23 10 - 35 U/L Final          Failed - HCT in normal range and within 180 days    HCT  Date Value Ref Range Status  12/14/2019 47.4 38.5 - 50.0 % Final          Failed - HGB in normal range and within 180 days    Hemoglobin  Date Value Ref Range Status  12/14/2019 15.6 13.2 - 17.1 g/dL Final          Failed - PLT in normal range and within 180 days    Platelets  Date Value Ref Range Status  12/14/2019 280 140 - 400 Thousand/uL Final          Failed - Valid encounter within last 6 months    Recent Outpatient Visits           1 year ago Senile purpura [facility])   [facility] Alba Cory, MD   2 years ago Pure hypercholesterolemia   [facility] Alba Cory, MD   2 years ago Encounter for routine history and physical exam for male   [facility] Alba Cory, MD   3 years ago Pure hypercholesterolemia   [facility] Alba Cory, MD   4 years ago Encounter for routine history and physical exam for male   [facility] Alba Cory, MD       Future Appointments             In 1 week Alba Cory, MD [facility]               atorvastatin  (LIPITOR) 80 MG tablet [Pharmacy Med Name: ATORVASTATIN 80 MG TAB[*]] 30 tablet 0    Sig: TAKE ONE TABLET BY MOUTH ONE TIME DAILY AT 6:00PM      Cardiovascular:  Antilipid - Statins Failed - 12/16/2020  9:27 AM      Failed - Valid encounter within last 12 months    Recent Outpatient Visits           1 year ago Senile purpura [facility])   [facility] Alba Cory, MD   2 years ago Pure hypercholesterolemia   [facility] Alba Cory, MD   2 years ago Encounter for routine history and physical exam for male   [facility] Alba Cory, MD   3 years ago Pure hypercholesterolemia   [facility] Alba Cory, MD   4 years ago Encounter for routine history and physical exam for male  [facility] Alba Cory, MD       Future Appointments             In 1 week Alba Cory, MD [facility]             Passed - Total Cholesterol in normal range and within 360 days    Cholesterol, Total  Date Value Ref Range Status  08/18/2020 141 100 - 199 mg/dL Final          Passed - LDL in normal range and within 360 days    LDL Cholesterol (Calc)  Date Value Ref Range Status  12/14/2019 138 (H) mg/dL (calc) Final    Comment:    Reference range: <100 . Desirable range <100 mg/dL for primary prevention;   <70 mg/dL for patients with CHD or diabetic patients  with > or = 2 CHD risk factors. Marland Kitchen LDL-C is now calculated using the Martin-Hopkins  calculation, which is a validated novel method providing  better accuracy than the Friedewald equation in the  estimation of LDL-C.  Horald Pollen et al. Lenox Ahr. 8315;176(16): 2061-2068  (http://education.[employer].com/faq/FAQ164)    LDL Chol Calc (NIH)  Date Value Ref Range Status  08/18/2020 62 0 - 99 mg/dL Final          Passed - HDL in normal range and within 360 days    HDL  Date Value Ref Range  Status  08/18/2020 66 >39 mg/dL Final          Passed - Triglycerides in normal range and within 360 days    Triglycerides  Date Value Ref Range Status  08/18/2020 66 0 - 149 mg/dL Final          Passed - Patient is not pregnant        lisinopril (ZESTRIL) 10 MG tablet [Pharmacy Med Name: LISINOPRIL 10 MG TAB[*]] 30 tablet 0    Sig: TAKE ONE TABLET BY MOUTH ONE TIME DAILY      Cardiovascular:  ACE Inhibitors Failed - 12/16/2020  9:27 AM      Failed - Cr in normal range and within 180 days    Creat  Date Value Ref Range Status  12/14/2019 0.83 0.70 - 1.25 mg/dL Final    Comment:    For patients >69 years of age, the reference limit for Creatinine is approximately 13% higher for people identified as African-American. .           Failed - K in normal range and within 180 days    Potassium  Date Value Ref Range Status  12/14/2019 4.8 3.5 - 5.3 mmol/L Final          Failed - Valid encounter within last 6 months    Recent Outpatient Visits           1 year ago Senile purpura [facility])   [facility] Alba Cory, MD   2 years ago Pure hypercholesterolemia   [facility] Alba Cory, MD   2 years ago Encounter for routine history and physical exam for male   [facility] Alba Cory, MD   3 years ago Pure hypercholesterolemia   [facility] Alba Cory, MD   4 years ago Encounter for routine history and physical exam for male   [facility] Alba [facility], MD       Future Appointments             In  1 week Alba Cory, MD [facility]             Passed - Patient is not pregnant      Passed - Last BP in normal range    BP Readings from Last 1 Encounters:  05/09/20 130/80

## 2020-12-17 ENCOUNTER — Other Ambulatory Visit: Payer: Self-pay

## 2020-12-17 DIAGNOSIS — K219 Gastro-esophageal reflux disease without esophagitis: Secondary | ICD-10-CM

## 2020-12-17 MED ORDER — FAMOTIDINE 40 MG PO TABS
40.0000 mg | ORAL_TABLET | Freq: Every day | ORAL | 0 refills | Status: DC
Start: 2020-12-17 — End: 2021-05-01

## 2020-12-17 NOTE — Telephone Encounter (Signed)
lvm to inform that script has been sent to pharmacy 

## 2020-12-23 ENCOUNTER — Ambulatory Visit: Payer: BC Managed Care – PPO | Admitting: Family Medicine

## 2021-01-23 ENCOUNTER — Ambulatory Visit: Payer: BC Managed Care – PPO | Admitting: Family Medicine

## 2021-02-18 ENCOUNTER — Other Ambulatory Visit: Payer: Self-pay | Admitting: Family Medicine

## 2021-02-18 DIAGNOSIS — E7800 Pure hypercholesterolemia, unspecified: Secondary | ICD-10-CM

## 2021-02-18 DIAGNOSIS — N528 Other male erectile dysfunction: Secondary | ICD-10-CM

## 2021-02-18 DIAGNOSIS — Z8673 Personal history of transient ischemic attack (TIA), and cerebral infarction without residual deficits: Secondary | ICD-10-CM

## 2021-02-18 DIAGNOSIS — I10 Essential (primary) hypertension: Secondary | ICD-10-CM

## 2021-02-18 NOTE — Telephone Encounter (Signed)
Notes to clinic:  Patient has appointment on 03/13/2021 Review for refill   Requested Prescriptions  Pending Prescriptions Disp Refills   atorvastatin (LIPITOR) 80 MG tablet [Pharmacy Med Name: ATORVASTATIN 80 MG TAB[*]] 30 tablet 0    Sig: TAKE ONE TABLET BY MOUTH ONE TIME DAILY AT 6:00PM      Cardiovascular:  Antilipid - Statins Failed - 02/18/2021 11:50 AM      Failed - Valid encounter within last 12 months    Recent Outpatient Visits           1 year ago Senile pur[facility])   [facility] Alba Cory, MD   2 years ago Pure hypercholesterolemia   [facility] Alba Cory, MD   2 years ago Encounter for routine history and physical exam for male   [facility] Alba Cory, MD   3 years ago Pure hypercholesterolemia   [facility] [place], Danna Hefty, MD   4 years ago Encounter for routine history and physical exam for male   [facility] Alba Cory, MD       Future Appointments             In 3 weeks Alba Cory, MD [facility]             Passed - Total Cholesterol in normal range and within 360 days    Cholesterol, Total  Date Value Ref Range Status  08/18/2020 141 100 - 199 mg/dL Final          Passed - LDL in normal range and within 360 days    LDL Cholesterol (Calc)  Date Value Ref Range Status  12/14/2019 138 (H) mg/dL (calc) Final    Comment:    Reference range: <100 . Desirable range <100 mg/dL for primary prevention;   <70 mg/dL for patients with CHD or diabetic patients  with > or = 2 CHD risk factors. Marland Kitchen LDL-C is now calculated using the Martin-Hopkins  calculation, which is a validated novel method providing  better accuracy than the Friedewald equation in the  estimation of LDL-C.  Horald Pollen et al. Lenox Ahr. 4010;272(53): 2061-2068  (http://education.[facility].com/faq/FAQ164)    LDL Chol Calc (NIH)   Date Value Ref Range Status  08/18/2020 62 0 - 99 mg/dL Final          Passed - HDL in normal range and within 360 days    HDL  Date Value Ref Range Status  08/18/2020 66 >39 mg/dL Final          Passed - Triglycerides in normal range and within 360 days    Triglycerides  Date Value Ref Range Status  08/18/2020 66 0 - 149 mg/dL Final          Passed - Patient is not pregnant        lisinopril (ZESTRIL) 10 MG tablet [Pharmacy Med Name: LISINOPRIL 10 MG TAB[*]] 30 tablet 0    Sig: TAKE ONE TABLET BY MOUTH ONE TIME DAILY      Cardiovascular:  ACE Inhibitors Failed - 02/18/2021 11:50 AM      Failed - Cr in normal range and within 180 days    Creat  Date Value Ref Range Status  12/14/2019 0.83 0.70 - 1.25 mg/dL Final    Comment:    For patients >48 years of age, the reference limit for Creatinine is approximately 13% higher for people identified as African-American. Marland Kitchen  Failed - K in normal range and within 180 days    Potassium  Date Value Ref Range Status  12/14/2019 4.8 3.5 - 5.3 mmol/L Final          Failed - Valid encounter within last 6 months    Recent Outpatient Visits           1 year ago Senile purpura [facility])   [facility] Alba Cory, MD   2 years ago Pure hypercholesterolemia   [facility] Al[facility] Cory, MD   2 years ago Encounter for routine history and physical exam for male   [facility] Alba Cory, MD   3 years ago Pure hypercholesterolemia   [facility] Alba Cory, MD   4 years ago Encounter for routine history and physical exam for male   [facility] Alba Cory, MD       Future Appointments             In 3 weeks Alba Cory, MD [facility]             Passed - Patient is not pregnant      Passed - Last BP in normal range    BP Readings from Last 1 Encounters:  05/09/20  130/80

## 2021-02-18 NOTE — Telephone Encounter (Signed)
Last seen 5.7.2022 next sch'd 8.5.2022

## 2021-03-13 ENCOUNTER — Ambulatory Visit: Payer: BC Managed Care – PPO | Admitting: Family Medicine

## 2021-03-25 NOTE — Progress Notes (Deleted)
Name: Raymond Chambers   MRN: [id number]    DOB: 03-Jan-1956   Date:03/25/2021       Progress Note  Subjective  Chief Complaint  Medication Refill  HPI  Hyperlipidemia: taking Atorvastatin as prescribed, he states leg cramps have improved    HTN: his bp is a little hight today but he has been out of medication for the past week. He denies chest pain or palpitation. Usually in the 120's range    TIA: he was at work on 04/2015 and developed acute onset of dizziness. Followed by left facial tingling, left leg and arm tingling and heaviness, the episode lasted about 2 minutes . He went to [facility] and transferred to [facility], he had multiple tests done, abnormal findings ( stable mild apical pleural thickening and bicuspid Aortic Valve, increase in in subcortical hyper intensities slightly greater than expected for age) and elevated LDL.  He has not been compliant with Atorvastatin and Plavix. He is willing to see cardiologist and neurologist   SOB with activity: he can only due the elliptical for 3 minutes.    History of alcoholism: used to be a heavy drinker, currently only drinks one glass of wine with wife a few times a week.    ED: he is able to have an erection but unable to maintain, doing well on viagra    GERD: he states worse symptoms at night, waking up during the night with substernal burning. Discussed not eating for 2-3 hours before bed, discussed GERD appropriate diet and we will try pepcid for now   Patient Active Problem List   Diagnosis Date Noted   BPH associated with nocturia 03/24/2018   Colon cancer screening    Rectal polyp    Mild aortic regurgitation 11/26/2016   History of alcoholism (HCC) 04/17/2015   ED (erectile dysfunction) of non-organic origin 04/17/2015   Acid reflux 04/17/2015   Herniation of nucleus pulposus 04/17/2015   Bilateral inguinal hernia 04/17/2015   H/O malignant neoplasm of skin 04/17/2015   Bicuspid aortic valve 04/17/2015   HLD  (hyperlipidemia)    Hx of transient ischemic attack (TIA) 04/16/2015    Past Surgical History:  Procedure Laterality Date   COLONOSCOPY WITH PROPOFOL N/A 03/29/2017   Procedure: COLONOSCOPY WITH PROPOFOL;  Surgeon: Midge Minium, MD;  Location: [facility] ENDOSCOPY;  Service: Endoscopy;  Laterality: N/A;   KNEE SURGERY Left 2005    Family History  Problem Relation Age of Onset   Cancer Father        Stomach    Heart disease Father    Diabetes Maternal Grandfather    Alcohol abuse Paternal Uncle    Obesity Brother     Social History   Tobacco Use   Smoking status: Former    Types: Pipe    Start date: 08/10/2003    Quit date: 08/09/2013    Years since quitting: 7.6   Smokeless tobacco: Never  Substance Use Topics   Alcohol use: Yes    Alcohol/week: 1.0 standard drink    Types: 1 Glasses of wine per week    Comment: wine with dinner very occasionally     Current Outpatient Medications:    atorvastatin (LIPITOR) 80 MG tablet, TAKE ONE TABLET BY MOUTH ONE TIME DAILY AT 6:00PM, Disp: 30 tablet, Rfl: 0   clopidogrel (PLAVIX) 75 MG tablet, TAKE ONE TABLET BY MOUTH ONE TIME DAILY, Disp: 30 tablet, Rfl: 0   ezetimibe (ZETIA) 10 MG tablet, Take 1 tablet (10 mg  total) by mouth daily., Disp: 90 tablet, Rfl: 3   famotidine (PEPCID) 40 MG tablet, Take 1 tablet (40 mg total) by mouth at bedtime., Disp: 90 tablet, Rfl: 0   lisinopril (ZESTRIL) 10 MG tablet, TAKE ONE TABLET BY MOUTH ONE TIME DAILY, Disp: 30 tablet, Rfl: 0   sildenafil (VIAGRA) 100 MG tablet, Take 1 tablet (100 mg total) by mouth as needed for erectile dysfunction. OFFICE VISIT NEEDED FOR ADDITIONAL REFILLS, Disp: 30 tablet, Rfl: 0   terbinafine (LAMISIL) 250 MG tablet, Take 1 tablet (250 mg total) by mouth daily., Disp: 90 tablet, Rfl: 0  Allergies  Allergen Reactions   Penicillins Hives    I personally reviewed {Reviewed:14835} with the patient/caregiver today.   ROS  ***  Objective  There were no vitals filed for  this visit.  There is no height or weight on file to calculate BMI.  Physical Exam ***  No results found for this or any previous visit (from the past 2160 hour(s)).  Diabetic Foot Exam: Diabetic Foot Exam - Simple   No data filed    ***  PHQ2/9: Depression screen [facility] 2/9 12/14/2019 11/10/2018 03/24/2018 09/23/2017 11/26/2016  Decreased Interest 0 0 0 0 0  Down, Depressed, Hopeless 0 0 0 0 0  PHQ - 2 Score 0 0 0 0 0  Altered sleeping 0 0 0 - -  Tired, decreased energy 0 0 0 - -  Change in appetite 0 0 0 - -  Feeling bad or failure about yourself  0 0 0 - -  Trouble concentrating 0 1 0 - -  Moving slowly or fidgety/restless 0 0 0 - -  Suicidal thoughts 0 0 0 - -  PHQ-9 Score 0 1 0 - -  Difficult doing work/chores - Not difficult at all Not difficult at all - -    phq 9 is {gen pos [id number]} ***  Fall Risk: Fall Risk  12/14/2019 11/10/2018 03/24/2018 09/23/2017 11/26/2016  Falls in the past year? 0 0 No No No  Number falls in past yr: 0 0 - - -  Injury with Fall? 0 0 - - -   ***   Functional Status Survey:   ***   Assessment & Plan  *** There are no diagnoses linked to this encounter.

## 2021-03-26 DIAGNOSIS — H35032 Hypertensive retinopathy, left eye: Secondary | ICD-10-CM | POA: Diagnosis not present

## 2021-03-26 DIAGNOSIS — H524 Presbyopia: Secondary | ICD-10-CM | POA: Diagnosis not present

## 2021-03-26 DIAGNOSIS — H5203 Hypermetropia, bilateral: Secondary | ICD-10-CM | POA: Diagnosis not present

## 2021-03-26 DIAGNOSIS — H52223 Regular astigmatism, bilateral: Secondary | ICD-10-CM | POA: Diagnosis not present

## 2021-03-27 ENCOUNTER — Ambulatory Visit: Payer: BC Managed Care – PPO | Admitting: Family Medicine

## 2021-04-24 ENCOUNTER — Other Ambulatory Visit: Payer: Self-pay | Admitting: Family Medicine

## 2021-04-24 DIAGNOSIS — Z8673 Personal history of transient ischemic attack (TIA), and cerebral infarction without residual deficits: Secondary | ICD-10-CM

## 2021-05-01 ENCOUNTER — Other Ambulatory Visit: Payer: Self-pay

## 2021-05-01 ENCOUNTER — Encounter: Payer: Self-pay | Admitting: Family Medicine

## 2021-05-01 ENCOUNTER — Ambulatory Visit: Payer: BC Managed Care – PPO | Admitting: Family Medicine

## 2021-05-01 VITALS — BP 116/76 | HR 94 | Temp 97.8°F | Resp 16 | Ht 71.0 in | Wt 165.4 lb

## 2021-05-01 DIAGNOSIS — N401 Enlarged prostate with lower urinary tract symptoms: Secondary | ICD-10-CM

## 2021-05-01 DIAGNOSIS — R351 Nocturia: Secondary | ICD-10-CM

## 2021-05-01 DIAGNOSIS — D692 Other nonthrombocytopenic purpura: Secondary | ICD-10-CM | POA: Diagnosis not present

## 2021-05-01 DIAGNOSIS — E7800 Pure hypercholesterolemia, unspecified: Secondary | ICD-10-CM

## 2021-05-01 DIAGNOSIS — I351 Nonrheumatic aortic (valve) insufficiency: Secondary | ICD-10-CM

## 2021-05-01 DIAGNOSIS — I10 Essential (primary) hypertension: Secondary | ICD-10-CM | POA: Diagnosis not present

## 2021-05-01 DIAGNOSIS — N528 Other male erectile dysfunction: Secondary | ICD-10-CM

## 2021-05-01 DIAGNOSIS — K219 Gastro-esophageal reflux disease without esophagitis: Secondary | ICD-10-CM | POA: Diagnosis not present

## 2021-05-01 DIAGNOSIS — Z79899 Other long term (current) drug therapy: Secondary | ICD-10-CM

## 2021-05-01 DIAGNOSIS — F1021 Alcohol dependence, in remission: Secondary | ICD-10-CM

## 2021-05-01 DIAGNOSIS — I712 Thoracic aortic aneurysm, without rupture, unspecified: Secondary | ICD-10-CM | POA: Insufficient documentation

## 2021-05-01 DIAGNOSIS — Z8673 Personal history of transient ischemic attack (TIA), and cerebral infarction without residual deficits: Secondary | ICD-10-CM

## 2021-05-01 DIAGNOSIS — I77810 Thoracic aortic ectasia: Secondary | ICD-10-CM

## 2021-05-01 LAB — COMPLETE METABOLIC PANEL WITH GFR
AG Ratio: 1.8 (calc) (ref 1.0–2.5)
ALT: 17 U/L (ref 9–46)
AST: 21 U/L (ref 10–35)
Albumin: 4.3 g/dL (ref 3.6–5.1)
Alkaline phosphatase (APISO): 63 U/L (ref 35–144)
BUN: 16 mg/dL (ref 7–25)
CO2: 29 mmol/L (ref 20–32)
Calcium: 9.4 mg/dL (ref 8.6–10.3)
Chloride: 100 mmol/L (ref 98–110)
Creat: 0.99 mg/dL (ref 0.70–1.35)
Globulin: 2.4 g/dL (calc) (ref 1.9–3.7)
Glucose, Bld: 112 mg/dL — ABNORMAL HIGH (ref 65–99)
Potassium: 4.4 mmol/L (ref 3.5–5.3)
Sodium: 135 mmol/L (ref 135–146)
Total Bilirubin: 0.6 mg/dL (ref 0.2–1.2)
Total Protein: 6.7 g/dL (ref 6.1–8.1)
eGFR: 85 mL/min/{1.73_m2} (ref >=60)

## 2021-05-01 LAB — PSA: PSA: 1.35 ng/mL (ref <=4.00)

## 2021-05-01 MED ORDER — SILDENAFIL CITRATE 100 MG PO TABS: 100.0000 mg | ORAL_TABLET | ORAL | 0 refills | Status: DC | PRN

## 2021-05-01 MED ORDER — FAMOTIDINE 40 MG PO TABS: 40.0000 mg | ORAL_TABLET | Freq: Every day | ORAL | 1 refills | Status: DC

## 2021-05-01 MED ORDER — CLOPIDOGREL BISULFATE 75 MG PO TABS: 75.0000 mg | ORAL_TABLET | Freq: Every day | ORAL | 1 refills | Status: DC

## 2021-05-01 MED ORDER — ATORVASTATIN CALCIUM 80 MG PO TABS: 80.0000 mg | ORAL_TABLET | Freq: Every day | ORAL | 1 refills | Status: DC

## 2021-05-01 NOTE — Patient Instructions (Signed)
05/13/2021 10:00 AM  at  [facility] [address]

## 2021-05-01 NOTE — Progress Notes (Signed)
Name: Raymond Chambers   MRN: [id number]    DOB: Jun 23, 1956   Date:05/01/2021       Progress Note  Subjective  Chief Complaint  Medication Refill  HPI  Hyperlipidemia: taking Atorvastatin as prescribed, he states leg cramps have improved , we will recheck comp panel today    HTN: bp is towards low end of normal, he had one episode of scotomas that lasted about 15 minutes while walking a couple of months ago. No headaches or weakness associated with episode It resolved by itself, she has seen ophthalmologist and mentioned that , he also has floaters, he is monitoring for retinal detachment He is not taking lisinopril at this time    TIA: he was at work on 04/2015 and developed acute onset of dizziness. Followed by left facial tingling, left leg and arm tingling and heaviness, the episode lasted about 2 minutes . He went to [facility] and transferred to [facility], he had multiple tests done, abnormal findings ( stable mild apical pleural thickening and bicuspid Aortic Valve, increase in in subcortical hyper intensities slightly greater than expected for age) and elevated LDL.  He has been compliant with Atorvastatin and Plavix.     History of alcoholism: used to be a heavy drinker, currently only drinking one glass of wine a couple of times a month    ED: he is able to have an erection but unable to maintain, doing well on viagra . Unchanged    GERD: controlled as long as he takes medications every night   Aortic Root dilation: found by cardiologist 4.3 cm on Echocardiogram, Dr. Ervin Knack oredered a CT angio but it does not look like it was done . We contacted him and they will re-schedule the procedure for [name]  Patient Active Problem List   Diagnosis Date Noted   Aortic root dilation (HCC) 05/01/2021   BPH associated with nocturia 03/24/2018   Rectal polyp    Mild aortic regurgitation 11/26/2016   History of alcoholism (HCC) 04/17/2015   ED (erectile dysfunction) of non-organic  origin 04/17/2015   Acid reflux 04/17/2015   Herniation of nucleus pulposus 04/17/2015   Bilateral inguinal hernia 04/17/2015   H/O malignant neoplasm of skin 04/17/2015   Bicuspid aortic valve 04/17/2015   HLD (hyperlipidemia)    Hx of transient ischemic attack (TIA) 04/16/2015    Past Surgical History:  Procedure Laterality Date   COLONOSCOPY WITH PROPOFOL N/A 03/29/2017   Procedure: COLONOSCOPY WITH PROPOFOL;  Surgeon: Midge Minium, MD;  Location: [facility] ENDOSCOPY;  Service: Endoscopy;  Laterality: N/A;   KNEE SURGERY Left 2005    Family History  Problem Relation Age of Onset   Cancer Father        Stomach    Heart disease Father    Diabetes Maternal Grandfather    Alcohol abuse Paternal Uncle    Obesity Brother     Social History   Tobacco Use   Smoking status: Former    Types: Pipe    Start date: 08/10/2003    Quit date: 08/09/2013    Years since quitting: 7.7   Smokeless tobacco: Never  Substance Use Topics   Alcohol use: Yes    Alcohol/week: 1.0 standard drink    Types: 1 Glasses of wine per week    Comment: wine with dinner very occasionally     Current Outpatient Medications:    atorvastatin (LIPITOR) 80 MG tablet, Take 1 tablet (80 mg total) by mouth daily., Disp: 90 tablet,  Rfl: 1   clopidogrel (PLAVIX) 75 MG tablet, Take 1 tablet (75 mg total) by mouth daily., Disp: 90 tablet, Rfl: 1   ezetimibe (ZETIA) 10 MG tablet, Take 1 tablet (10 mg total) by mouth daily., Disp: 90 tablet, Rfl: 3   famotidine (PEPCID) 40 MG tablet, Take 1 tablet (40 mg total) by mouth at bedtime., Disp: 90 tablet, Rfl: 1   sildenafil (VIAGRA) 100 MG tablet, Take 1 tablet (100 mg total) by mouth as needed for erectile dysfunction. OFFICE VISIT NEEDED FOR ADDITIONAL REFILLS, Disp: 30 tablet, Rfl: 0  Allergies  Allergen Reactions   Penicillins Hives    I personally reviewed active problem list, medication list, allergies, family history, social history, health maintenance with the  patient/caregiver today.   ROS  Constitutional: Negative for fever or weight change.  Respiratory: Negative for cough and shortness of breath.   Cardiovascular: Negative for chest pain or palpitations.  Gastrointestinal: Negative for abdominal pain, no bowel changes.  Musculoskeletal: Negative for gait problem or joint swelling.  Skin: Negative for rash.  Neurological: Negative for dizziness or headache.  No other specific complaints in a complete review of systems (except as listed in HPI above).   Objective  Vitals:   05/01/21 1447  BP: 116/76  Pulse: 94  Resp: 16  Temp: 97.8 F (36.6 C)  TempSrc: Oral  SpO2: 96%  Weight: 165 lb 6.4 oz (75 kg)  Height: 5\' 11"  (1.803 m)    Body mass index is 23.07 kg/m.  Physical Exam  Constitutional: Patient appears well-developed and well-nourished.  No distress.  HEENT: head atraumatic, normocephalic, pupils equal and reactive to light,  neck supple Cardiovascular: Normal rate, regular rhythm and normal heart sounds.   murmur heard. No BLE edema. Pulmonary/Chest: Effort normal and breath sounds normal. No respiratory distress. Abdominal: Soft.  There is no tenderness. Psychiatric: Patient has a normal mood and affect. behavior is normal. Judgment and thought content normal.   PHQ2/9: Depression screen [facility] 2/9 05/01/2021 12/14/2019 11/10/2018 03/24/2018 09/23/2017  Decreased Interest 0 0 0 0 0  Down, Depressed, Hopeless 0 0 0 0 0  PHQ - 2 Score 0 0 0 0 0  Altered sleeping 0 0 0 0 -  Tired, decreased energy 0 0 0 0 -  Change in appetite 0 0 0 0 -  Feeling bad or failure about yourself  0 0 0 0 -  Trouble concentrating 0 0 1 0 -  Moving slowly or fidgety/restless 0 0 0 0 -  Suicidal thoughts 0 0 0 0 -  PHQ-9 Score 0 0 1 0 -  Difficult doing work/chores - - Not difficult at all Not difficult at all -    phq 9 is negative   Fall Risk: Fall Risk  05/01/2021 12/14/2019 11/10/2018 03/24/2018 09/23/2017  Falls in the past year? 0 0 0 No No   Number falls in past yr: - 0 0 - -  Injury with Fall? - 0 0 - -  Follow up Falls prevention discussed - - - -     Functional Status Survey: Is the patient deaf or have difficulty hearing?: No Does the patient have difficulty seeing, even when wearing glasses/contacts?: Yes (occasional floaters) Does the patient have difficulty concentrating, remembering, or making decisions?: No Does the patient have difficulty walking or climbing stairs?: No Does the patient have difficulty dressing or bathing?: No Does the patient have difficulty doing errands alone such as visiting a doctor's office or shopping?: No  Assessment & Plan  1. Moderate aortic regurgitation  Keep follow up with cardiologist   2. Senile purpura (HCC)  Stable, likely from plavix  3. Gastroesophageal reflux disease without esophagitis  - famotidine (PEPCID) 40 MG tablet; Take 1 tablet (40 mg total) by mouth at bedtime.  Dispense: 90 tablet; Refill: 1  4. Hypertension, benign  Bp is at goal, and off medication   5. History of alcoholism (HCC)   6. Pure hypercholesterolemia  - atorvastatin (LIPITOR) 80 MG tablet; Take 1 tablet (80 mg total) by mouth daily.  Dispense: 90 tablet; Refill: 1  7. BPH associated with nocturia  - PSA  8. Hx of transient ischemic attack (TIA)  - clopidogrel (PLAVIX) 75 MG tablet; Take 1 tablet (75 mg total) by mouth daily.  Dispense: 90 tablet; Refill: 1 - atorvastatin (LIPITOR) 80 MG tablet; Take 1 tablet (80 mg total) by mouth daily.  Dispense: 90 tablet; Refill: 1  9. Long-term use of high-risk medication  - COMPLETE METABOLIC PANEL WITH GFR  10. Aortic root dilation (HCC)  He will have CT angio done next month   11. Other male erectile dysfunction  - sildenafil (VIAGRA) 100 MG tablet; Take 1 tablet (100 mg total) by mouth as needed for erectile dysfunction. OFFICE VISIT NEEDED FOR ADDITIONAL REFILLS  Dispense: 30 tablet; Refill: 0

## 2021-05-13 ENCOUNTER — Ambulatory Visit (INDEPENDENT_AMBULATORY_CARE_PROVIDER_SITE_OTHER)
Admission: RE | Admit: 2021-05-13 | Discharge: 2021-05-13 | Disposition: A | Discharge status: Home / Self Care | Payer: BC Managed Care – PPO | Source: Ambulatory Visit | Attending: Internal Medicine | Admitting: Internal Medicine

## 2021-05-13 ENCOUNTER — Other Ambulatory Visit: Payer: Self-pay

## 2021-05-13 DIAGNOSIS — I7121 Aneurysm of the ascending aorta, without rupture: Secondary | ICD-10-CM | POA: Diagnosis not present

## 2021-05-13 DIAGNOSIS — K449 Diaphragmatic hernia without obstruction or gangrene: Secondary | ICD-10-CM | POA: Diagnosis not present

## 2021-05-13 DIAGNOSIS — I517 Cardiomegaly: Secondary | ICD-10-CM | POA: Diagnosis not present

## 2021-05-13 DIAGNOSIS — I77819 Aortic ectasia, unspecified site: Secondary | ICD-10-CM | POA: Diagnosis not present

## 2021-05-13 DIAGNOSIS — J9811 Atelectasis: Secondary | ICD-10-CM | POA: Diagnosis not present

## 2021-05-13 MED ORDER — IOHEXOL 350 MG/ML SOLN
80.0000 mL | Freq: Once | INTRAVENOUS | Status: AC | PRN
  Administered 2021-05-13: 80 mL via INTRAVENOUS

## 2021-05-21 ENCOUNTER — Other Ambulatory Visit: Payer: Self-pay | Admitting: Internal Medicine

## 2021-05-21 DIAGNOSIS — I77819 Aortic ectasia, unspecified site: Secondary | ICD-10-CM

## 2021-05-21 DIAGNOSIS — H43813 Vitreous degeneration, bilateral: Secondary | ICD-10-CM | POA: Diagnosis not present

## 2021-05-21 DIAGNOSIS — I712 Thoracic aortic aneurysm, without rupture, unspecified: Secondary | ICD-10-CM

## 2021-05-21 DIAGNOSIS — H52223 Regular astigmatism, bilateral: Secondary | ICD-10-CM | POA: Diagnosis not present

## 2021-05-21 DIAGNOSIS — H35032 Hypertensive retinopathy, left eye: Secondary | ICD-10-CM | POA: Diagnosis not present

## 2021-05-21 DIAGNOSIS — H5203 Hypermetropia, bilateral: Secondary | ICD-10-CM | POA: Diagnosis not present

## 2021-06-30 ENCOUNTER — Other Ambulatory Visit: Payer: Self-pay

## 2021-06-30 ENCOUNTER — Telehealth: Payer: Self-pay | Admitting: Internal Medicine

## 2021-06-30 DIAGNOSIS — E7800 Pure hypercholesterolemia, unspecified: Secondary | ICD-10-CM

## 2021-06-30 DIAGNOSIS — Z8673 Personal history of transient ischemic attack (TIA), and cerebral infarction without residual deficits: Secondary | ICD-10-CM

## 2021-06-30 DIAGNOSIS — K219 Gastro-esophageal reflux disease without esophagitis: Secondary | ICD-10-CM

## 2021-06-30 MED ORDER — CLOPIDOGREL BISULFATE 75 MG PO TABS: 75.0000 mg | ORAL_TABLET | Freq: Every day | ORAL | 3 refills | Status: DC

## 2021-06-30 MED ORDER — FAMOTIDINE 40 MG PO TABS: 40.0000 mg | ORAL_TABLET | Freq: Every day | ORAL | 3 refills | Status: DC

## 2021-06-30 MED ORDER — ATORVASTATIN CALCIUM 80 MG PO TABS: 80.0000 mg | ORAL_TABLET | Freq: Every day | ORAL | 3 refills | Status: DC

## 2021-06-30 NOTE — Telephone Encounter (Signed)
*  STAT* If patient is at the pharmacy, call can be transferred to refill team.   1. Which medications need to be refilled? (please list name of each medication and dose if known) ezetimibe (ZETIA) 10 MG tablet  2. Which pharmacy/location (including street and city if local pharmacy) is medication to be sent to?[facility] [address] - [place], [place] - [address]  3. Do they need a 30 day or 90 day supply? 30 day   Patient is out of medication

## 2021-07-01 MED ORDER — EZETIMIBE 10 MG PO TABS: 10.0000 mg | ORAL_TABLET | Freq: Every day | ORAL | 0 refills | Status: DC

## 2021-07-01 NOTE — Telephone Encounter (Signed)
Rx(s) sent to pharmacy electronically.  

## 2021-07-10 DIAGNOSIS — R21 Rash and other nonspecific skin eruption: Secondary | ICD-10-CM | POA: Diagnosis not present

## 2021-07-24 ENCOUNTER — Ambulatory Visit: Payer: Self-pay | Admitting: *Deleted

## 2021-07-24 DIAGNOSIS — B86 Scabies: Secondary | ICD-10-CM | POA: Diagnosis not present

## 2021-07-24 NOTE — Telephone Encounter (Signed)
°  Chief Complaint: widespread rash Symptoms: itching , red dry pinprick bumps Frequency: 2 weeks Pertinent Negatives: Patient denies fever Disposition: [] ED /[] Urgent Care (no appt availability in office) / [x] Appointment(In office/virtual)/ []  [facility]/ [] Home Care/ [] Refused Recommended Disposition  Additional Notes:

## 2021-07-24 NOTE — Telephone Encounter (Signed)
Message from [place] sent at 07/24/2021 11:24 AM EST  Summary: Rash on back/chest   Pt's wife is calling stated pt has a rash on his chest and back area. Lesions are red and inflamed has some itching.  Stated his started a couple of weeks ago.   Seeking clinical advise.   Please call - [phone]          Reason for Disposition  Localized rash present > 7 days  Answer Assessment - Initial Assessment Questions 1. APPEARANCE of RASH: "Describe the rash."      Pin prick bump- red and crusty 2. LOCATION: "Where is the rash located?"      Back to chest 3. NUMBER: "How many spots are there?"      multiple 4. SIZE: "How big are the spots?" (Inches, centimeters or compare to size of a coin)      Back- whole back, chest- torso 5. ONSET: "When did the rash start?"      Couple weeks- getting larger and spreading 6. ITCHING: "Does the rash itch?" If Yes, ask: "How bad is the itch?"  (Scale 0-10; or none, mild, moderate, severe)     Moderate/severe 7. PAIN: "Does the rash hurt?" If Yes, ask: "How bad is the pain?"  (Scale 0-10; or none, mild, moderate, severe)    - NONE (0): no pain    - MILD (1-3): doesn't interfere with normal activities     - MODERATE (4-7): interferes with normal activities or awakens from sleep     - SEVERE (8-10): excruciating pain, unable to do any normal activities     no 8. OTHER SYMPTOMS: "Do you have any other symptoms?" (e.g., fever)     no 9. PREGNANCY: "Is there any chance you are pregnant?" "When was your last menstrual period?"     *No Answer*  Protocols used: Rash or Redness - Localized-A-AH

## 2021-07-27 ENCOUNTER — Encounter: Payer: Self-pay | Admitting: Internal Medicine

## 2021-07-27 ENCOUNTER — Ambulatory Visit: Payer: BC Managed Care – PPO | Admitting: Internal Medicine

## 2021-07-27 VITALS — BP 132/84 | HR 93 | Temp 98.1°F | Resp 16 | Ht 71.0 in | Wt 170.8 lb

## 2021-07-27 DIAGNOSIS — B86 Scabies: Secondary | ICD-10-CM

## 2021-07-27 NOTE — Progress Notes (Signed)
Acute Office Visit  Subjective:    Patient ID: Raymond Chambers, male    DOB: 1956-03-06, 65 y.o.   MRN: [id number]  Chief Complaint  Patient presents with   Rash    Wide spread and itchy.  Saw provider at work and they dx w/ scabies.  Pt does not think he has this and is something else    HPI Patient is in today for rash. 2 weeks, in the middle of back, spread to front of chest wall. Seeing small pinprick spots. New laundry degeternat, stopped that. Not getting worse, staying the same. Tried hydrocortisone cream which did not help and lotion Helped temporatily. No one else at home with this. Other provider thought scabies, didn't try treatment because he wanted a second opinion.   RASH Duration: 2 weeks  Location: started on back, now on chest wall, abdomen and down both arms and starting on thighs Itching: yes Burning: no Redness: yes Oozing: no Scaling: no Blisters: no Painful: no Fevers: no Change in detergents/soaps/personal care products: yes Recent illness: no Recent travel:no History of same: no Context: worse Alleviating factors: nothing Treatments attempted:hydrocortisone cream and lotion/moisturizer Shortness of breath: no  Throat/tongue swelling: no Myalgias/arthralgias: no   Past Medical History:  Diagnosis Date   Abnormal CXR (chest x-ray)    Repeat CXR in 6 months   Alcohol abuse, in remission    Bilateral inguinal hernia    Cyst of finger    Depression    GERD (gastroesophageal reflux disease)    Herniated lumbar intervertebral disc    L-5   History of attention deficit disorder    History of skin cancer    Hyperlipidemia    Stroke (HCC)    TIA     Past Surgical History:  Procedure Laterality Date   COLONOSCOPY WITH PROPOFOL N/A 03/29/2017   Procedure: COLONOSCOPY WITH PROPOFOL;  Surgeon: Lucilla Lame, MD;  Location: [facility] ENDOSCOPY;  Service: Endoscopy;  Laterality: N/A;   KNEE SURGERY Left 2005    Family History  Problem Relation Age of  Onset   Cancer Father        Stomach    Heart disease Father    Diabetes Maternal Grandfather    Alcohol abuse Paternal Uncle    Obesity Brother     Social History   Socioeconomic History   Marital status: Married    Spouse name: Water quality scientist   Number of children: 0   Years of education: Not on file   Highest education level: Some college, no degree  Occupational History   Not on file  Tobacco Use   Smoking status: Former    Types: Pipe    Start date: 08/10/2003    Quit date: 08/09/2013    Years since quitting: 7.9   Smokeless tobacco: Never  Vaping Use   Vaping Use: Never used  Substance and Sexual Activity   Alcohol use: Yes    Alcohol/week: 1.0 standard drink    Types: 1 Glasses of wine per week    Comment: wine with dinner very occasionally   Drug use: No   Sexual activity: Yes    Partners: Female  Other Topics Concern   Not on file  Social History Narrative   Not on file   Social Determinants of Health   Financial Resource Strain: Not on file  Food Insecurity: Not on file  Transportation Needs: Not on file  Physical Activity: Not on file  Stress: Not on file  Social Connections: Not on  file  Intimate Partner Violence: Not on file    Outpatient Medications Prior to Visit  Medication Sig Dispense Refill   atorvastatin (LIPITOR) 80 MG tablet Take 1 tablet (80 mg total) by mouth daily. 30 tablet 3   clopidogrel (PLAVIX) 75 MG tablet Take 1 tablet (75 mg total) by mouth daily. 30 tablet 3   ezetimibe (ZETIA) 10 MG tablet Take 1 tablet (10 mg total) by mouth daily. <PLEASE MAKE APPOINTMENT FOR REFILLS> 90 tablet 0   famotidine (PEPCID) 40 MG tablet Take 1 tablet (40 mg total) by mouth at bedtime. 30 tablet 3   sildenafil (VIAGRA) 100 MG tablet Take 1 tablet (100 mg total) by mouth as needed for erectile dysfunction. OFFICE VISIT NEEDED FOR ADDITIONAL REFILLS 30 tablet 0   No facility-administered medications prior to visit.    Allergies  Allergen Reactions    Penicillins Hives    Review of Systems  Constitutional:  Negative for chills and fever.  Eyes:  Negative for visual disturbance.  Respiratory:  Negative for cough and shortness of breath.   Cardiovascular:  Negative for chest pain.  Skin:  Positive for rash.      Objective:    Physical Exam Constitutional:      Appearance: Normal appearance.  HENT:     Head: Normocephalic and atraumatic.  Eyes:     Conjunctiva/sclera: Conjunctivae normal.  Musculoskeletal:     Right lower leg: No edema.     Left lower leg: No edema.  Skin:    General: Skin is warm and dry.     Comments: Multiple linear erythematous papules, intensely pruritic over entire back, chest wall, abdomen, both arms and legs. No lesions in between fingers    Neurological:     General: No focal deficit present.     Mental Status: He is alert. Mental status is at baseline.  Psychiatric:        Mood and Affect: Mood normal.        Behavior: Behavior normal.    BP 132/84    Pulse 93    Temp 98.1 F (36.7 C)    Resp 16    Ht 5' 11"  (1.803 m)    Wt 170 lb 12.8 oz (77.5 kg)    SpO2 98%    BMI 23.82 kg/m  Wt Readings from Last 3 Encounters:  05/01/21 165 lb 6.4 oz (75 kg)  08/20/20 174 lb (78.9 kg)  05/09/20 171 lb (77.6 kg)    Health Maintenance Due  Topic Date Due   Pneumonia Vaccine 34+ Years old (2 - PCV) 11/27/2016   COVID-19 Vaccine (3 - Pfizer risk series) 02/05/2020    There are no preventive care reminders to display for this patient.   Lab Results  Component Value Date   TSH 1.29 03/24/2018   Lab Results  Component Value Date   WBC 6.7 12/14/2019   HGB 15.6 12/14/2019   HCT 47.4 12/14/2019   MCV 88.1 12/14/2019   PLT 280 12/14/2019   Lab Results  Component Value Date   NA 135 05/01/2021   K 4.4 05/01/2021   CO2 29 05/01/2021   GLUCOSE 112 (H) 05/01/2021   BUN 16 05/01/2021   CREATININE 0.99 05/01/2021   BILITOT 0.6 05/01/2021   ALKPHOS 76 06/03/2016   AST 21 05/01/2021   ALT 17  05/01/2021   PROT 6.7 05/01/2021   ALBUMIN 4.1 06/03/2016   CALCIUM 9.4 05/01/2021   ANIONGAP 7 04/16/2015   EGFR 85 05/01/2021  Lab Results  Component Value Date   CHOL 141 08/18/2020   Lab Results  Component Value Date   HDL 66 08/18/2020   Lab Results  Component Value Date   LDLCALC 62 08/18/2020   Lab Results  Component Value Date   TRIG 66 08/18/2020   Lab Results  Component Value Date   CHOLHDL 2.1 08/18/2020   Lab Results  Component Value Date   HGBA1C 5.1 12/14/2019       Assessment & Plan:   1. Scabies: Was already sent in Permethrin 5% cream, recommend he use this and leave it for 8-14 hours. We also discussed washing any clothing, bedding, towels, etc in hot water or leaving in an airtight bag for 3 days until the scabies can die off. He understands and is agreeable. Follow up as needed.    Teodora Medici, DO

## 2021-07-27 NOTE — Patient Instructions (Addendum)
It was great seeing you today!  Plan discussed at today's visit: -Use Permethrin cream on entire body, leave on 8-14 hours -Wash any clothing, towel, bedding, etc in hot water or leave in a trash bag for 3 days to kill all the mites  Follow up in: as needed   Take care and let us know if you have any questions or concerns prior to your next visit.  Dr. Caralee Ates   Scabies, Adult Scabies is a skin condition that happens when very small insects called mites get under the skin (infestation). This causes a rash and severe itchiness. Scabies is contagious, which means it can spread from person to person. If you get scabies, it is common for others in your household to get scabies too. With proper treatment, symptoms usually go away in 2-4 weeks. Scabies usually does not cause lasting problems. What are the causes? This condition is caused by tiny mites (Sarcoptes scabiei, or human itch mites) that can only be seen with a microscope. The mites get into the top layer of skin and lay eggs. Scabies can spread from person to person through: Close contact with a person who has scabies. Sharing or having contact with infested items, such as towels, bedding, or clothing. What increases the risk? The following factors may make you more likely to develop this condition: Living in a nursing home or other extended care facility. Having sexual contact with a partner who has scabies. Caring for others who are at increased risk for scabies. What are the signs or symptoms? Symptoms of this condition include: Severe itchiness. This is often worse at night. A rash that includes tiny red bumps or blisters. The rash commonly occurs on the hands, wrists, elbows, armpits, chest, waist, groin, or buttocks. The bumps may form a line (burrow) in some areas. Skin irritation. This can include scaly patches or sores. How is this diagnosed? This condition may be diagnosed based on: A physical exam of the skin. A skin  test. Your health care provider may take a sample of your affected skin (skin scraping) and have it examined under a microscope for signs of mites. How is this treated? This condition may be treated with: Medicated cream or lotion that kills the mites. This is spread on the entire body and left on for several hours. Usually, one treatment with medicated cream or lotion is enough to kill all the mites. In severe cases, the treatment may need to be repeated. Medicated cream that relieves itching. Medicines taken by mouth (orally) that: Relieve itching. Reduce the swelling and redness. Kill the mites. This treatment may be done in severe cases. Follow these instructions at home: Medicines Take or apply over-the-counter and prescription medicines only as told by your health care provider. Apply medicated cream or lotion as told by your health care provider. Do not wash off the medicated cream or lotion until the necessary amount of time has passed. Skin care Avoid scratching the affected areas of your skin. Keep your fingernails closely trimmed to reduce injury from scratching. Take cool baths or apply cool washcloths to your skin to help reduce itching. General instructions Clean all items that you had contact with during the 3 days before diagnosis. This includes bedding, clothing, towels, and furniture. Do this on the same day that you start treatment. Dry-clean items, or use hot water to wash items. Dry items on the hot dry cycle. Place items that cannot be washed into closed, airtight plastic bags for at least 3  days. The mites cannot live for more than 3 days away from human skin. Vacuum furniture and mattresses that you use. Make sure that other people who may have been infested are examined by a health care provider. These include members of your household and anyone who may have had contact with infested items. Keep all follow-up visits. This is important. Where to find more  information Centers for Disease Control and Prevention: [employer] Contact a health care provider if: You have itching that does not go away after 4 weeks of treatment. You continue to develop new bumps or burrows. You have redness, swelling, or pain in your rash area after treatment. You have fluid, blood, or pus coming from your rash. Summary Scabies is a skin condition that causes a rash and severe itchiness. This condition is caused by tiny mites that get into the top layer of the skin and lay eggs. Scabies can spread from person to person. Follow treatments as recommended by your health care provider. Clean all items that you recently had contact with. This information is not intended to replace advice given to you by your health care provider. Make sure you discuss any questions you have with your health care provider. Document Revised: 11/23/2019 Document Reviewed: 11/23/2019 Elsevier Patient Education  2022 [employer].

## 2021-07-28 ENCOUNTER — Other Ambulatory Visit: Payer: Self-pay | Admitting: Family Medicine

## 2021-07-28 NOTE — Telephone Encounter (Signed)
Medication Refill - Medication:  permethrin (ELIMITE) 5 % cream   Has the patient contacted their pharmacy? Yes.   Contact PCP  Preferred Pharmacy (with phone number or street name):  [facility] [address] [facility] - [place], [place] - [address] Phone:  [phone]  Fax:  [phone]      Has the patient been seen for an appointment in the last year OR does the patient have an upcoming appointment? Yes.    Agent: Please be advised that RX refills may take up to 3 business days. We ask that you follow-up with your pharmacy.

## 2021-07-29 NOTE — Telephone Encounter (Signed)
Requested medications are due for refill today.  Unsure  Requested medications are on the active medications list.  yes  Last refill. 07/24/2021  Future visit scheduled.   yes  Notes to clinic.  Rx written by historical provider. Last refilled 07/24/2021. Note also states that pt is not using 07/27/2021.    Requested Prescriptions  Pending Prescriptions Disp Refills   permethrin (ELIMITE) 5 % cream 60 g     Sig: Apply topically.     Off-Protocol Failed - 07/28/2021  3:22 PM      Failed - Medication not assigned to a protocol, review manually.      Passed - Valid encounter within last 12 months    Recent Outpatient Visits           2 days ago Scabies   [facility] Margarita Mail, DO   2 months ago Moderate aortic regurgitation   [facility] Alba Cory, MD   1 year ago Senile purpura [facility])   [facility] Alba Cory, MD   2 years ago Pure hypercholesterolemia   [facility] Alba Cory, MD   3 years ago Encounter for routine history and physical exam for male   [facility] Alba Cory, MD       Future Appointments             In 3 months Carlynn Purl, Danna Hefty, MD [facility], [facility]   In 5 months Alba Cory, MD [facility], [facility]

## 2021-10-26 ENCOUNTER — Other Ambulatory Visit: Payer: Self-pay | Admitting: Family Medicine

## 2021-10-26 DIAGNOSIS — Z8673 Personal history of transient ischemic attack (TIA), and cerebral infarction without residual deficits: Secondary | ICD-10-CM

## 2021-10-26 DIAGNOSIS — K219 Gastro-esophageal reflux disease without esophagitis: Secondary | ICD-10-CM

## 2021-10-30 ENCOUNTER — Ambulatory Visit: Payer: BC Managed Care – PPO | Admitting: Family Medicine

## 2021-10-31 ENCOUNTER — Other Ambulatory Visit: Payer: Self-pay | Admitting: Family Medicine

## 2021-11-09 ENCOUNTER — Other Ambulatory Visit: Payer: Self-pay

## 2021-11-12 ENCOUNTER — Ambulatory Visit: Payer: BC Managed Care – PPO | Admitting: Family Medicine

## 2021-11-23 ENCOUNTER — Other Ambulatory Visit: Payer: Self-pay | Admitting: Family Medicine

## 2021-11-23 DIAGNOSIS — N528 Other male erectile dysfunction: Secondary | ICD-10-CM

## 2021-12-01 ENCOUNTER — Telehealth: Payer: Self-pay

## 2021-12-01 ENCOUNTER — Telehealth: Payer: Self-pay | Admitting: Gastroenterology

## 2021-12-01 DIAGNOSIS — Z86010 Personal history of colonic polyps: Secondary | ICD-10-CM

## 2021-12-01 MED ORDER — NA SULFATE-K SULFATE-MG SULF 17.5-3.13-1.6 GM/177ML PO SOLN: 1.0000 | Freq: Once | ORAL | 0 refills | Status: AC

## 2021-12-01 NOTE — Telephone Encounter (Signed)
Gastroenterology Pre-Procedure Review ? ?Request Date: 03/23/22 ?Requesting Physician: Dr. Allen Norris ? ?PATIENT REVIEW QUESTIONS: The patient responded to the following health history questions as indicated:   ? ?1. Are you having any GI issues? no ?2. Do you have a personal history of Polyps? Yes 03/29/17 colonoscopy performed by Dr. Allen Norris noted polyps ?3. Do you have a family history of Colon Cancer or Polyps? no ?4. Diabetes Mellitus? no ?5. Joint replacements in the past 12 months?no ?6. Major health problems in the past 3 months?no ?7. Any artificial heart valves, MVP, or defibrillator?no ?   ?MEDICATIONS & ALLERGIES:    ?Patient reports the following regarding taking any anticoagulation/antiplatelet therapy:   ?Plavix, Coumadin, Eliquis, Xarelto, Lovenox, Pradaxa, Brilinta, or Effient? no ?Aspirin? no ? ?Patient confirms/reports the following medications:  ? ? ? ?Patient confirms/reports the following allergies:  ?Allergies  ?Allergen Reactions  ? Penicillins Hives and Rash  ? ? ?No orders of the defined types were placed in this encounter. ? ? ?AUTHORIZATION INFORMATION ?Primary Insurance: ?1D#: ?Group #: ? ?Secondary Insurance: ?1D#: ?Group #: ? ?SCHEDULE INFORMATION: ?Date: 03/23/22 ?Time: ?Location: [facility] ?

## 2021-12-01 NOTE — Telephone Encounter (Signed)
Patients spouse called to get repeat colonoscopy scheduled for the patient. Requesting a call back. ?

## 2021-12-03 ENCOUNTER — Other Ambulatory Visit: Payer: Self-pay | Admitting: Family Medicine

## 2021-12-03 DIAGNOSIS — Z8673 Personal history of transient ischemic attack (TIA), and cerebral infarction without residual deficits: Secondary | ICD-10-CM

## 2021-12-10 NOTE — Progress Notes (Deleted)
Name: Raymond Chambers   MRN: [id number]    DOB: December 06, 1955   Date:12/10/2021       Progress Note  Subjective  Chief Complaint  Follow up   HPI  Hyperlipidemia: taking Atorvastatin as prescribed, he states leg cramps have improved , we will recheck comp panel today    HTN: bp is towards low end of normal, he had one episode of scotomas that lasted about 15 minutes while walking a couple of months ago. No headaches or weakness associated with episode It resolved by itself, she has seen ophthalmologist and mentioned that , he also has floaters, he is monitoring for retinal detachment He is not taking lisinopril at this time    TIA: he was at work on 04/2015 and developed acute onset of dizziness. Followed by left facial tingling, left leg and arm tingling and heaviness, the episode lasted about 2 minutes . [facility] to [facility] and transferred to [facility], he had multiple tests done, abnormal findings ( stable mild apical pleural thickening and bicuspid Aortic Valve, increase in in subcortical hyper intensities slightly greater than expected for age) and elevated LDL.  He has been compliant with Atorvastatin and Plavix.     History of alcoholism: used to be a heavy drinker, currently only drinking one glass of wine a couple of times a month    ED: he is able to have an erection but unable to maintain, doing well on viagra . Unchanged    GERD: controlled as long as he takes medications every night   Aortic Root dilation: found by cardiologist 4.3 cm on Echocardiogram, Dr. Ervin Knack oredered a CT angio but it does not look like it was done . We contacted him and they will re-schedule the procedure for Raymond Chambers  Patient Active Problem List   Diagnosis Date Noted   Aortic root dilation (HCC) 05/01/2021   BPH associated with nocturia 03/24/2018   Rectal polyp    Mild aortic regurgitation 11/26/2016   History of alcoholism (HCC) 04/17/2015   ED (erectile dysfunction) of non-organic origin  04/17/2015   Acid reflux 04/17/2015   Herniation of nucleus pulposus 04/17/2015   Bilateral inguinal hernia 04/17/2015   H/O malignant neoplasm of skin 04/17/2015   Bicuspid aortic valve 04/17/2015   HLD (hyperlipidemia)    Hx of transient ischemic attack (TIA) 04/16/2015    Past Surgical History:  Procedure Laterality Date   COLONOSCOPY WITH PROPOFOL N/A 03/29/2017   Procedure: COLONOSCOPY WITH PROPOFOL;  Surgeon: Midge Minium, MD;  Location: [facility] END[facility]COPY;  Service: Endoscopy;  Laterality: N/A;   KNEE SURGERY Left 2005    Family History  Problem Relation Age of Onset   Cancer Father        Stomach    Heart disease Father    Diabetes Maternal Grandfather    Alcohol abuse Paternal Uncle    Obesity Brother     Social History   Tobacco Use   Smoking status: Former    Types: Pipe    Start date: 08/10/2003    Quit date: 08/09/2013    Years since quitting: 8.3   Smokeless tobacco: Never  Substance Use Topics   Alcohol use: Yes    Alcohol/week: 1.0 standard drink    Types: 1 Glasses of wine per week    Comment: wine with dinner very occasionally     Current Outpatient Medications:    atorvastatin (LIPITOR) 80 MG tablet, Take 1 tablet (80 mg total) by mouth daily., Disp: 30  tablet, Rfl: 3   atorvastatin (LIPITOR) 80 MG tablet, Take 1 tablet by mouth daily., Disp: , Rfl:    clopidogrel (PLAVIX) 75 MG tablet, TAKE ONE TABLET BY MOUTH ONE TIME DAILY, Disp: 90 tablet, Rfl: 0   clopidogrel (PLAVIX) 75 MG tablet, Take 1 tablet by mouth daily., Disp: , Rfl:    diphenhydrAMINE (BENADRYL) 12.5 MG/5ML elixir, Take by mouth., Disp: , Rfl:    ezetimibe (ZETIA) 10 MG tablet, Take 1 tablet (10 mg total) by mouth daily. <PLEASE MAKE APPOINTMENT FOR REFILLS>, Disp: 90 tablet, Rfl: 0   famotidine (PEPCID) 40 MG tablet, TAKE ONE TABLET BY MOUTH AT BEDTIME, Disp: 90 tablet, Rfl: 0   fluticasone (FLONASE) 50 MCG/ACT nasal spray, USE TWO SPRAYS IN THE AFFECTED NOSTRIL ONE TIME DAILY, Disp: 16 mL,  Rfl: 0   fluticasone (FLONASE) 50 MCG/ACT nasal spray, Administer 2 sprays in each nostril daily., Disp: , Rfl:    permethrin (ELIMITE) 5 % cream, Apply topically. (Patient not taking: Reported on 07/27/2021), Disp: , Rfl:    sildenafil (VIAGRA) 100 MG tablet, TAKE ONE TABLET BY MOUTH DAILY AS NEEDED FOR ERECTILE DYSFUNCTION ***OFFICE VISIT NEEDED FOR ADDITIONAL REFILLS***, Disp: 30 tablet, Rfl: 0  Allergies  Allergen Reactions   Penicillins Hives and Rash    I personally reviewed {Reviewed:14835} with the patient/caregiver today.   ROS  ***  Objective  There were no vitals filed for this visit.  There is no height or weight on file to calculate BMI.  Physical Exam ***  No results found for this or any previous visit (from the past 2160 hour(s)).  Diabetic Foot Exam: Diabetic Foot Exam - Simple   No data filed    ***  PHQ2/9:    07/27/2021    3:49 PM 05/01/2021    2:49 PM 12/14/2019   10:48 AM 11/10/2018    8:53 AM 03/24/2018   10:33 AM  Depression screen PHQ 2/9  Decreased Interest 0 0 0 0 0  Down, Depressed, Hopeless 0 0 0 0 0  PHQ - 2 Score 0 0 0 0 0  Altered sleeping 0 0 0 0 0  Tired, decreased energy 0 0 0 0 0  Change in appetite 0 0 0 0 0  Feeling bad or failure about yourself  0 0 0 0 0  Trouble concentrating 0 0 0 1 0  Moving slowly or fidgety/restless 0 0 0 0 0  Suicidal thoughts 0 0 0 0 0  PHQ-9 Score 0 0 0 1 0  Difficult doing work/chores Not difficult at all   Not difficult at all Not difficult at all    phq 9 is {gen pos [id number]} ***  Fall Risk:    07/27/2021    3:49 PM 05/01/2021    2:39 PM 12/14/2019   10:48 AM 11/10/2018    8:51 AM 03/24/2018   10:33 AM  Fall Risk   Falls in the past year? 0 0 0 0 No  Number falls in past yr: 0  0 0   Injury with Fall? 0  0 0   Follow up  Falls prevention discussed      ***   Functional Status Survey:   ***   Assessment & Plan  *** There are no diagnoses linked to this encounter.

## 2021-12-11 ENCOUNTER — Ambulatory Visit: Payer: BC Managed Care – PPO | Admitting: Family Medicine

## 2021-12-29 ENCOUNTER — Other Ambulatory Visit: Payer: Self-pay

## 2021-12-29 DIAGNOSIS — Z86010 Personal history of colonic polyps: Secondary | ICD-10-CM

## 2022-01-05 ENCOUNTER — Encounter: Payer: BC Managed Care – PPO | Admitting: Family Medicine

## 2022-01-07 ENCOUNTER — Other Ambulatory Visit: Payer: Self-pay | Admitting: Internal Medicine

## 2022-01-07 ENCOUNTER — Other Ambulatory Visit: Payer: Self-pay | Admitting: Family Medicine

## 2022-01-07 DIAGNOSIS — E7800 Pure hypercholesterolemia, unspecified: Secondary | ICD-10-CM

## 2022-01-07 DIAGNOSIS — Z8673 Personal history of transient ischemic attack (TIA), and cerebral infarction without residual deficits: Secondary | ICD-10-CM

## 2022-01-07 DIAGNOSIS — L853 Xerosis cutis: Secondary | ICD-10-CM | POA: Diagnosis not present

## 2022-01-22 ENCOUNTER — Other Ambulatory Visit: Payer: BC Managed Care – PPO

## 2022-01-22 ENCOUNTER — Encounter: Payer: BC Managed Care – PPO | Admitting: Family Medicine

## 2022-01-27 ENCOUNTER — Ambulatory Visit (INDEPENDENT_AMBULATORY_CARE_PROVIDER_SITE_OTHER)
Admission: RE | Admit: 2022-01-27 | Discharge: 2022-01-27 | Disposition: A | Discharge status: Home / Self Care | Payer: BC Managed Care – PPO | Source: Ambulatory Visit | Attending: Internal Medicine | Admitting: Internal Medicine

## 2022-01-27 DIAGNOSIS — I77819 Aortic ectasia, unspecified site: Secondary | ICD-10-CM

## 2022-01-27 DIAGNOSIS — I712 Thoracic aortic aneurysm, without rupture, unspecified: Secondary | ICD-10-CM | POA: Diagnosis not present

## 2022-01-27 DIAGNOSIS — I7121 Aneurysm of the ascending aorta, without rupture: Secondary | ICD-10-CM | POA: Diagnosis not present

## 2022-01-27 MED ORDER — IOHEXOL 350 MG/ML SOLN
100.0000 mL | Freq: Once | INTRAVENOUS | Status: AC | PRN
  Administered 2022-01-27: 100 mL via INTRAVENOUS

## 2022-01-28 ENCOUNTER — Other Ambulatory Visit: Payer: Self-pay | Admitting: Family Medicine

## 2022-01-28 DIAGNOSIS — N528 Other male erectile dysfunction: Secondary | ICD-10-CM

## 2022-01-28 NOTE — Telephone Encounter (Signed)
Requested Prescriptions  Pending Prescriptions Disp Refills  . sildenafil (VIAGRA) 100 MG tablet [Pharmacy Med Name: SILDENAFIL 100 MG TABLET] 30 tablet 1    Sig: TAKE ONE TABLET BY MOUTH DAILY AS NEEDED FOR ERECTILE DYSFUNCTION     Urology: Erectile Dysfunction Agents Passed - 01/28/2022  4:53 PM      Passed - AST in normal range and within 360 days    AST  Date Value Ref Range Status  05/01/2021 21 10 - 35 U/L Final         Passed - ALT in normal range and within 360 days    ALT  Date Value Ref Range Status  05/01/2021 17 9 - 46 U/L Final         Passed - Last BP in normal range    BP Readings from Last 1 Encounters:  07/27/21 132/84         Passed - Valid encounter within last 12 months    Recent Outpatient Visits          6 months ago Scabies   [facility] Margarita Mail, DO   9 months ago Moderate aortic regurgitation   [facility] Alba Cory, MD   2 years ago Senile purpura [facility])   [facility] Alba Cory, MD   3 years ago Pure hypercholesterolemia   [facility] Alba Cory, MD   3 years ago Encounter for routine history and physical exam for male   [facility] Alba Cory, MD      Future Appointments            In 2 months Carlynn Purl, Danna Hefty, MD [facility], [facility]

## 2022-02-02 ENCOUNTER — Encounter: Payer: Self-pay | Admitting: Internal Medicine

## 2022-02-02 ENCOUNTER — Other Ambulatory Visit: Payer: Self-pay | Admitting: *Deleted

## 2022-02-02 DIAGNOSIS — I712 Thoracic aortic aneurysm, without rupture, unspecified: Secondary | ICD-10-CM

## 2022-02-12 ENCOUNTER — Ambulatory Visit: Payer: BC Managed Care – PPO | Admitting: Family Medicine

## 2022-03-23 ENCOUNTER — Ambulatory Visit
Admission: RE | Admit: 2022-03-23 | Discharge: 2022-03-23 | Disposition: A | Discharge status: Home / Self Care | Payer: BC Managed Care – PPO | Source: Ambulatory Visit | Attending: Gastroenterology | Admitting: Gastroenterology

## 2022-03-23 ENCOUNTER — Other Ambulatory Visit: Payer: Self-pay

## 2022-03-23 ENCOUNTER — Telehealth: Payer: Self-pay

## 2022-03-23 ENCOUNTER — Encounter: Payer: Self-pay | Admitting: General Practice

## 2022-03-23 ENCOUNTER — Encounter
Admission: RE | Disposition: A | Discharge status: Home / Self Care | Payer: Self-pay | Source: Ambulatory Visit | Attending: Gastroenterology

## 2022-03-23 ENCOUNTER — Encounter: Payer: Self-pay | Admitting: Gastroenterology

## 2022-03-23 DIAGNOSIS — Z5309 Procedure and treatment not carried out because of other contraindication: Secondary | ICD-10-CM | POA: Diagnosis not present

## 2022-03-23 DIAGNOSIS — Z1211 Encounter for screening for malignant neoplasm of colon: Secondary | ICD-10-CM | POA: Insufficient documentation

## 2022-03-23 DIAGNOSIS — Z86010 Personal history of colonic polyps: Secondary | ICD-10-CM | POA: Diagnosis not present

## 2022-03-23 SURGERY — COLONOSCOPY WITH PROPOFOL
Anesthesia: General

## 2022-03-23 MED ORDER — SODIUM CHLORIDE 0.9 % IV SOLN: INTRAVENOUS | Status: DC

## 2022-03-23 NOTE — Telephone Encounter (Signed)
Patients blood thinner request was sent over on 12/01/21 to Dr. Carlynn Purl office.  Pateints colonoscopy was scheduled for today, however it blood thinner request was not received back. Contacted [facility] to request blood thinner to be faxed back to [place] ASAP to reschedule patients colonoscopy with Dr. Servando Snare.  Thanks,  [place], [place]

## 2022-03-23 NOTE — Telephone Encounter (Signed)
Copied from CRM [id number]. Topic: General - Other >> Mar 23, 2022  8:19 AM Clide Dales wrote: Marcelino Duster from [facility] called and she needs a response to the blood thinner request that was sent on 12/01/2021 so they can reschedule the patient for a colonoscopy. Please assist further.

## 2022-03-23 NOTE — Progress Notes (Signed)
Took Plavix on Sunday. Dr. Servando Snare notified. Procedure will be rescheduled. Patient and wife educated about stopping Plavix 5 days before colonoscopy.

## 2022-03-23 NOTE — Telephone Encounter (Signed)
patient was not told to stop plavix. needs to be rescheduled

## 2022-03-23 NOTE — Telephone Encounter (Signed)
Copied from CRM #[id number]. Topic: General - Other >> Mar 23, 2022  8:19 AM Danielle M wrote: Michelle from [facility] called and she needs a response to the blood thinner request that was sent on 12/01/2021 so they can reschedule the patient for a colonoscopy. Please assist further. 

## 2022-04-02 ENCOUNTER — Encounter: Payer: BC Managed Care – PPO | Admitting: Family Medicine

## 2022-04-29 ENCOUNTER — Encounter: Payer: Self-pay | Admitting: *Deleted

## 2022-04-29 ENCOUNTER — Telehealth: Payer: Self-pay | Admitting: *Deleted

## 2022-04-29 NOTE — Telephone Encounter (Signed)
Patient left voicemail stating that he did not wake up in time to do the prep 5 hours before today colonoscopy procedure. He started at 7 am and his procedure was schedule for 9:15 am  I have called patient back and inform him that he did not have enough time for the prep, he will not clean enough for the procedure. We will need to reschedule his procedure again.  We have called [facility] and reschedule the colonoscopy to 06/03/2022 with Dr Allen Norris. Patient stop by the office and pick up new instructions and sample of Clenpiq.  Patient and wife verbalized understanding.

## 2022-05-25 ENCOUNTER — Telehealth: Payer: Self-pay | Admitting: Gastroenterology

## 2022-05-25 ENCOUNTER — Telehealth: Payer: Self-pay

## 2022-05-25 NOTE — Telephone Encounter (Signed)
Patients spouse called wanting to reschedule patients colonoscopy for a Thursday or Friday in December with Dr Allen Norris at [facility]. Requests call back.

## 2022-05-25 NOTE — Telephone Encounter (Signed)
Patients wife contacted office to reschedule her husbands colonoscopy with Dr. Allen Norris.  Colonoscopy has been rescheduled to 07/27/22.  Almyra Free in Endo notified of date change.  Thanks,  [place], [place]

## 2022-06-18 ENCOUNTER — Other Ambulatory Visit: Payer: Self-pay | Admitting: Family Medicine

## 2022-06-18 DIAGNOSIS — Z8673 Personal history of transient ischemic attack (TIA), and cerebral infarction without residual deficits: Secondary | ICD-10-CM

## 2022-06-18 NOTE — Telephone Encounter (Signed)
Courtesy refill. Future visit in 1 month.  Requested Prescriptions  Pending Prescriptions Disp Refills   clopidogrel (PLAVIX) 75 MG tablet [Pharmacy Med Name: CLOPIDOGREL 75 MG TAB[*]] 56 tablet 0    Sig: TAKE ONE TABLET BY MOUTH ONE TIME DAILY     Hematology: Antiplatelets - clopidogrel Failed - 06/18/2022  2:11 PM      Failed - HCT in normal range and within 180 days    HCT  Date Value Ref Range Status  12/14/2019 47.4 38.5 - 50.0 % Final         Failed - HGB in normal range and within 180 days    Hemoglobin  Date Value Ref Range Status  12/14/2019 15.6 13.2 - 17.1 g/dL Final         Failed - PLT in normal range and within 180 days    Platelets  Date Value Ref Range Status  12/14/2019 280 140 - 400 Thousand/uL Final         Failed - Cr in normal range and within 360 days    Creat  Date Value Ref Range Status  05/01/2021 0.99 0.70 - 1.35 mg/dL Final         Failed - Valid encounter within last 6 months    Recent Outpatient Visits           10 months ago Scabies   [facility] Margarita Mail, DO   1 year ago Moderate aortic regurgitation   [facility] Alba Cory, MD   2 years ago Senile purpura [facility])   [facility] Alba Cory, MD   3 years ago Pure hypercholesterolemia   [facility] Alba Cory, MD   4 years ago Encounter for routine history and physical exam for male   [facility] Alba Cory, MD       Future Appointments             In 1 month Carlynn Purl, Danna Hefty, MD [facility], [facility]

## 2022-07-14 ENCOUNTER — Encounter: Payer: BC Managed Care – PPO | Admitting: Family Medicine

## 2022-07-23 ENCOUNTER — Other Ambulatory Visit: Payer: Self-pay | Admitting: Internal Medicine

## 2022-07-27 ENCOUNTER — Ambulatory Visit
Admission: RE | Admit: 2022-07-27 | Discharge: 2022-07-27 | Disposition: A | Discharge status: Home / Self Care | Payer: BC Managed Care – PPO | Source: Ambulatory Visit | Attending: Gastroenterology | Admitting: Gastroenterology

## 2022-07-27 ENCOUNTER — Encounter
Admission: RE | Disposition: A | Discharge status: Home / Self Care | Payer: Self-pay | Source: Ambulatory Visit | Attending: Gastroenterology

## 2022-07-27 ENCOUNTER — Encounter: Payer: Self-pay | Admitting: Gastroenterology

## 2022-07-27 ENCOUNTER — Other Ambulatory Visit: Payer: Self-pay

## 2022-07-27 ENCOUNTER — Ambulatory Visit: Payer: BC Managed Care – PPO | Admitting: Anesthesiology

## 2022-07-27 DIAGNOSIS — Z87891 Personal history of nicotine dependence: Secondary | ICD-10-CM | POA: Insufficient documentation

## 2022-07-27 DIAGNOSIS — Z86010 Personal history of colonic polyps: Secondary | ICD-10-CM | POA: Diagnosis not present

## 2022-07-27 DIAGNOSIS — Z1211 Encounter for screening for malignant neoplasm of colon: Secondary | ICD-10-CM | POA: Diagnosis not present

## 2022-07-27 DIAGNOSIS — K640 First degree hemorrhoids: Secondary | ICD-10-CM | POA: Diagnosis not present

## 2022-07-27 DIAGNOSIS — K219 Gastro-esophageal reflux disease without esophagitis: Secondary | ICD-10-CM | POA: Insufficient documentation

## 2022-07-27 DIAGNOSIS — I639 Cerebral infarction, unspecified: Secondary | ICD-10-CM | POA: Diagnosis not present

## 2022-07-27 DIAGNOSIS — Z8673 Personal history of transient ischemic attack (TIA), and cerebral infarction without residual deficits: Secondary | ICD-10-CM | POA: Insufficient documentation

## 2022-07-27 DIAGNOSIS — K5730 Diverticulosis of large intestine without perforation or abscess without bleeding: Secondary | ICD-10-CM | POA: Insufficient documentation

## 2022-07-27 DIAGNOSIS — E785 Hyperlipidemia, unspecified: Secondary | ICD-10-CM | POA: Diagnosis not present

## 2022-07-27 HISTORY — PX: COLONOSCOPY WITH PROPOFOL: SHX5780

## 2022-07-27 SURGERY — COLONOSCOPY WITH PROPOFOL
Anesthesia: General

## 2022-07-27 MED ORDER — DEXMEDETOMIDINE HCL IN NACL 80 MCG/20ML IV SOLN
INTRAVENOUS | Status: DC | PRN
  Administered 2022-07-27: 8 ug via INTRAVENOUS

## 2022-07-27 MED ORDER — SODIUM CHLORIDE 0.9 % IV SOLN: INTRAVENOUS | Status: DC

## 2022-07-27 MED ORDER — PROPOFOL 10 MG/ML IV BOLUS
INTRAVENOUS | Status: DC | PRN
  Administered 2022-07-27: 80 mg via INTRAVENOUS

## 2022-07-27 MED ORDER — LIDOCAINE HCL (CARDIAC) PF 100 MG/5ML IV SOSY
PREFILLED_SYRINGE | INTRAVENOUS | Status: DC | PRN
  Administered 2022-07-27: 80 mg via INTRAVENOUS

## 2022-07-27 MED ORDER — PROPOFOL 500 MG/50ML IV EMUL
INTRAVENOUS | Status: DC | PRN
  Administered 2022-07-27: 140 ug/kg/min via INTRAVENOUS

## 2022-07-27 NOTE — Transfer of Care (Signed)
Immediate Anesthesia Transfer of Care Note  Patient: Raymond Chambers  Procedure(s) Performed: COLONOSCOPY WITH PROPOFOL  Patient Location: PACU  Anesthesia Type:General  Level of Consciousness: awake, alert , and oriented  Airway & Oxygen Therapy: Patient Spontanous Breathing  Post-op Assessment: Report given to RN and Post -op Vital signs reviewed and stable  Post vital signs: Reviewed and stable  Last Vitals:  Vitals Value Taken Time  BP 101/69 07/27/22 0845  Temp    Pulse 79 07/27/22 0845  Resp 13 07/27/22 0845  SpO2 99 % 07/27/22 0845    Last Pain:  Vitals:   07/27/22 0754  TempSrc: Temporal  PainSc: 0-No pain         Complications: No notable events documented.

## 2022-07-27 NOTE — Anesthesia Postprocedure Evaluation (Signed)
Anesthesia Post Note  Patient: Raymond Chambers  Procedure(s) Performed: COLONOSCOPY WITH PROPOFOL  Patient location during evaluation: Endoscopy Anesthesia Type: General Level of consciousness: awake and alert Pain management: pain level controlled Vital Signs Assessment: post-procedure vital signs reviewed and stable Respiratory status: spontaneous breathing, nonlabored ventilation and respiratory function stable Cardiovascular status: blood pressure returned to baseline and stable Postop Assessment: no apparent nausea or vomiting Anesthetic complications: no   No notable events documented.   Last Vitals:  Vitals:   07/27/22 0855 07/27/22 0905  BP: 109/75 117/81  Pulse: 71 74  Resp: 17 15  Temp:    SpO2: 99% 100%    Last Pain:  Vitals:   07/27/22 0905  TempSrc:   PainSc: 0-No pain                 Christia Reading

## 2022-07-27 NOTE — Anesthesia Preprocedure Evaluation (Signed)
Anesthesia Evaluation  Patient identified by MRN, date of birth, ID band Patient awake    Reviewed: Allergy & Precautions, NPO status , Patient's Chart, lab work & pertinent test results  Airway Mallampati: II  TM Distance: >3 FB Neck ROM: full    Dental  (+) Chipped, Poor Dentition, Missing   Pulmonary neg pulmonary ROS, former smoker   Pulmonary exam normal        Cardiovascular negative cardio ROS Normal cardiovascular exam  10/21 ECHO 1. Left ventricular ejection fraction, by estimation, is 60 to 65%. The  left ventricle has normal function. The left ventricle has no regional  wall motion abnormalities. Left ventricular diastolic parameters were  normal.   2. Right ventricular systolic function is normal. The right ventricular  size is normal. There is normal pulmonary artery systolic pressure.   3. The mitral valve is normal in structure. Trivial mitral valve  regurgitation. No evidence of mitral stenosis.   4. The aortic valve is tricuspid. Aortic valve regurgitation is moderate.  No aortic stenosis is present. Aortic regurgitation PHT measures 480 msec.   5. Aortic dilatation noted. There is mild dilatation at the level of the  sinuses of Valsalva, measuring 41 mm.   6. The inferior vena cava is normal in size with greater than 50%  respiratory variability, suggesting right atrial pressure of 3 mmHg.     Neuro/Psych  PSYCHIATRIC DISORDERS  Depression    CVA, No Residual Symptoms negative neurological ROS  negative psych ROS   GI/Hepatic negative GI ROS, Neg liver ROS,GERD  Medicated,,  Endo/Other  negative endocrine ROS    Renal/GU negative Renal ROS  negative genitourinary   Musculoskeletal   Abdominal   Peds  Hematology negative hematology ROS (+)   Anesthesia Other Findings Past Medical History: No date: Abnormal CXR (chest x-ray)     Comment:  Repeat CXR in 6 months No date: Alcohol abuse, in  remission No date: Bilateral inguinal hernia No date: Cyst of finger No date: Depression No date: GERD (gastroesophageal reflux disease) No date: Herniated lumbar intervertebral disc     Comment:  L-5 No date: History of attention deficit disorder No date: History of skin cancer No date: Hyperlipidemia No date: Stroke [facility])     Comment:  TIA   Past Surgical History: 03/29/2017: COLONOSCOPY WITH PROPOFOL; N/A     Comment:  Procedure: COLONOSCOPY WITH PROPOFOL;  Surgeon: Midge Minium, MD;  Location: [facility] ENDOSCOPY;  Service:               Endoscopy;  Laterality: N/A; 2005: KNEE SURGERY; Left  BMI    Body Mass Index: 22.70 kg/m      Reproductive/Obstetrics negative OB ROS                             Anesthesia Physical Anesthesia Plan  ASA: 3  Anesthesia Plan: General   Post-op Pain Management:    Induction:   PONV Risk Score and Plan: Propofol infusion and TIVA  Airway Management Planned:   Additional Equipment:   Intra-op Plan:   Post-operative Plan:   Informed Consent: I have reviewed the patients History and Physical, chart, labs and discussed the procedure including the risks, benefits and alternatives for the proposed anesthesia with the patient or authorized representative who has indicated his/her understanding and acceptance.     Dental Advisory Given  Plan Discussed with: Anesthesiologist, CRNA and Surgeon  Anesthesia Plan Comments:        Anesthesia Quick Evaluation

## 2022-07-27 NOTE — H&P (Signed)
Midge Minium, MD [facility] [address] [place], [place] [address] Phone:[phone] Fax : [phone]  Primary Care Physician:  Alba Cory, MD Primary Gastroenterologist:  Dr. Servando Snare  Pre-Procedure History & Physical: HPI:  Raymond Chambers is a 66 y.o. male is here for an colonoscopy.   Past Medical History:  Diagnosis Date   Abnormal CXR (chest x-ray)    Repeat CXR in 6 months   Alcohol abuse, in remission    Bilateral inguinal hernia    Cyst of finger    Depression    GERD (gastroesophageal reflux disease)    Herniated lumbar intervertebral disc    L-5   History of attention deficit disorder    History of skin cancer    Hyperlipidemia    Stroke (HCC)    TIA     Past Surgical History:  Procedure Laterality Date   COLONOSCOPY WITH PROPOFOL N/A 03/29/2017   Procedure: COLONOSCOPY WITH PROPOFOL;  Surgeon: Midge Minium, MD;  Location: [facility] ENDOSCOPY;  Service: Endoscopy;  Laterality: N/A;   KNEE SURGERY Left 2005    Prior to Admission medications   Medication Sig Start Date End Date Taking? Authorizing Provider  atorvastatin (LIPITOR) 80 MG tablet TAKE ONE TABLET BY MOUTH ONE TIME DAILY 01/08/22   Margarita Mail, DO  clopidogrel (PLAVIX) 75 MG tablet Take 1 tablet by mouth daily. 05/28/16   [provider]  clopidogrel (PLAVIX) 75 MG tablet TAKE ONE TABLET BY MOUTH ONE TIME DAILY 06/18/22   Alba Cory, MD  diphenhydrAMINE (BENADRYL) 12.5 MG/5ML elixir Take by mouth.    [provider]  ezetimibe (ZETIA) 10 MG tablet TAKE ONE TABLET BY MOUTH ONE TIME DAILY, APPOINTMENT NEEDED FOR REFILLS 07/23/22   Hilty, Lisette Abu, MD  famotidine (PEPCID) 40 MG tablet TAKE ONE TABLET BY MOUTH AT BEDTIME 10/27/21   Sowles, Danna Hefty, MD  fluticasone (FLONASE) 50 MCG/ACT nasal spray USE TWO SPRAYS IN THE AFFECTED NOSTRIL ONE TIME DAILY 11/01/21   Alba Cory, MD  fluticasone (FLONASE) 50 MCG/ACT nasal spray Administer 2 sprays in each nostril daily.  09/24/21   [provider]  permethrin (ELIMITE) 5 % cream Apply topically. Patient not taking: Reported on 07/27/2021 07/24/21   [provider]  sildenafil (VIAGRA) 100 MG tablet TAKE ONE TABLET BY MOUTH DAILY AS NEEDED FOR ERECTILE DYSFUNCTION 01/28/22   Alba Cory, MD    Allergies as of 03/23/2022 - Review Complete 03/23/2022  Allergen Reaction Noted   Penicillins Hives and Rash 04/16/2015    Family History  Problem Relation Age of Onset   Cancer Father        Stomach    Heart disease Father    Diabetes Maternal Grandfather    Alcohol abuse Paternal Uncle    Obesity Brother     Social History   Socioeconomic History   Marital status: Married    Spouse name: Research scientist (physical sciences)   Number of children: 0   Years of education: Not on file   Highest education level: Some college, no degree  Occupational History   Not on file  Tobacco Use   Smoking status: Former    Types: Pipe    Start date: 08/10/2003    Quit date: 08/09/2013    Years since quitting: 8.9   Smokeless tobacco: Never  Vaping Use   Vaping Use: Never used  Substance and Sexual Activity   Alcohol use: Yes    Alcohol/week: 1.0 standard drink of alcohol    Types: 1 Glasses of wine  per week    Comment: wine with dinner very occasionally   Drug use: No   Sexual activity: Yes    Partners: Female  Other Topics Concern   Not on file  Social History Narrative   Not on file   Social Determinants of Health   Financial Resource Strain: Low Risk  (12/14/2019)   Overall Financial Resource Strain (CARDIA)    Difficulty of Paying Living Expenses: Not hard at all  Food Insecurity: No Food Insecurity (12/14/2019)   Hunger Vital Sign    Worried About Running Out of Food in the Last Year: Never true    [place] in the Last Year: Never true  Transportation Needs: No Transportation Needs (12/14/2019)   PRAPARE - Hydrologist (Medical): No    Lack of Transportation (Non-Medical):  No  Physical Activity: Inactive (12/14/2019)   Exercise Vital Sign    Days of Exercise per Week: 0 days    Minutes of Exercise per Session: 0 min  Stress: No Stress Concern Present (12/14/2019)   [place]    Feeling of Stress : Not at all  Social Connections: Moderately Integrated (12/14/2019)   Social Connection and Isolation Panel [NHANES]    Frequency of Communication with Friends and Family: Twice a week    Frequency of Social Gatherings with Friends and Family: Twice a week    Attends Religious Services: Never    Marine scientist or Organizations: Yes    Attends Music therapist: More than 4 times per year    Marital Status: Married  Human resources officer Violence: Not At Risk (12/14/2019)   Humiliation, Afraid, Rape, and Kick questionnaire    Fear of Current or Ex-Partner: No    Emotionally Abused: No    Physically Abused: No    Sexually Abused: No    Review of Systems: See HPI, otherwise negative ROS  Physical Exam: BP (!) 153/103   Pulse 89   Temp (!) 97.4 F (36.3 C) (Temporal)   Resp 20   Ht 6' (1.829 m)   Wt 75.9 kg   SpO2 98%   BMI 22.70 kg/m  General:   Alert,  pleasant and cooperative in NAD Head:  Normocephalic and atraumatic. Neck:  Supple; no masses or thyromegaly. Lungs:  Clear throughout to auscultation.    Heart:  Regular rate and rhythm. Abdomen:  Soft, nontender and nondistended. Normal bowel sounds, without guarding, and without rebound.   Neurologic:  Alert and  oriented x4;  grossly normal neurologically.  Impression/Plan: Raymond Chambers is here for an colonoscopy to be performed for a history of adenomatous polyps on 2018   Risks, benefits, limitations, and alternatives regarding  colonoscopy have been reviewed with the patient.  Questions have been answered.  All parties agreeable.   Lucilla Lame, MD  07/27/2022, 8:45 AM

## 2022-07-27 NOTE — Op Note (Signed)
[facility] Gastroenterology Patient Name: Raymond Chambers Procedure Date: 07/27/2022 8:17 AM MRN: [id number] Account #: [id number] Date of Birth: May 07, 1956 Admit Type: Outpatient Age: 66 Room: [facility] ENDO ROOM 4 Gender: Male Note Status: Finalized Instrument Name: Jasper Riling [id number] Procedure:             Colonoscopy Indications:           High risk colon cancer surveillance: Personal history                         of colonic polyps Providers:             Lucilla Lame MD, MD Medicines:             Propofol per Anesthesia Complications:         No immediate complications. Procedure:             Pre-Anesthesia Assessment:                        - Prior to the procedure, a History and Physical was                         performed, and patient medications and allergies were                         reviewed. The patient's tolerance of previous                         anesthesia was also reviewed. The risks and benefits                         of the procedure and the sedation options and risks                         were discussed with the patient. All questions were                         answered, and informed consent was obtained. Prior                         Anticoagulants: The patient has taken no anticoagulant                         or antiplatelet agents. ASA Grade Assessment: II - A                         patient with mild systemic disease. After reviewing                         the risks and benefits, the patient was deemed in                         satisfactory condition to undergo the procedure.                        After obtaining informed consent, the colonoscope was                         passed under direct vision. Throughout  the procedure,                         the patient's blood pressure, pulse, and oxygen                         saturations were monitored continuously. The                         Colonoscope was introduced through the  anus and                         advanced to the the cecum, identified by appendiceal                         orifice and ileocecal valve. The colonoscopy was                         performed without difficulty. The patient tolerated                         the procedure well. The quality of the bowel                         preparation was excellent. Findings:      The perianal and digital rectal examinations were normal.      Multiple small-mouthed diverticula were found in the sigmoid colon.      Non-bleeding internal hemorrhoids were found during retroflexion. The       hemorrhoids were Grade I (internal hemorrhoids that do not prolapse). Impression:            - Diverticulosis in the sigmoid colon.                        - Non-bleeding internal hemorrhoids.                        - No specimens collected. Recommendation:        - Discharge patient to home.                        - Resume previous diet.                        - Repeat colonoscopy in 7 years for surveillance. Procedure Code(s):     --- Professional ---                        [phone], Colonoscopy, flexible; diagnostic, including                         collection of specimen(s) by brushing or washing, when                         performed (separate procedure) Diagnosis Code(s):     --- Professional ---                        Z86.010, Personal history of colonic polyps CPT copyright 2022 American Medical Association. All rights reserved. The codes documented in this report are preliminary and upon coder  review may  be revised to meet current compliance requirements. Lucilla Lame MD, MD 07/27/2022 8:43:42 AM This report has been signed electronically. Number of Addenda: 0 Note Initiated On: 07/27/2022 8:17 AM Scope Withdrawal Time: 0 hours 7 minutes 51 seconds  Total Procedure Duration: 0 hours 13 minutes 42 seconds  Estimated Blood Loss:  Estimated blood loss: none.      [facility]

## 2022-07-28 ENCOUNTER — Encounter: Payer: Self-pay | Admitting: Gastroenterology

## 2022-08-11 NOTE — Progress Notes (Deleted)
Name: Raymond Chambers   MRN: [id number]    DOB: [birth year]   Date:08/11/2022       Progress Note  Subjective  Chief Complaint  Annual Exam  HPI  Patient presents for annual CPE.  IPSS Questionnaire (AUA-7): Over the past month.   1)  How often have you had a sensation of not emptying your bladder completely after you finish urinating?  {Rating:19227}  2)  How often have you had to urinate again less than two hours after you finished urinating? {Rating:19227}  3)  How often have you found you stopped and started again several times when you urinated?  {Rating:19227}  4) How difficult have you found it to postpone urination?  {Rating:19227}  5) How often have you had a weak urinary stream?  {Rating:19227}  6) How often have you had to push or strain to begin urination?  {Rating:19227}  7) How many times did you most typically get up to urinate from the time you went to bed until the time you got up in the morning?  {Rating:19228}  Total score:  0-7 mildly symptomatic   8-19 moderately symptomatic   20-35 severely symptomatic     Diet: *** Exercise: *** Last Dental Exam: **** Last Eye Exam: ***  Depression: phq 9 is {gen pos neg:315643}    07/27/2021    3:49 PM 05/01/2021    2:49 PM 12/14/2019   10:48 AM 11/10/2018    8:53 AM 03/24/2018   10:33 AM  Depression screen PHQ 2/9  Decreased Interest 0 0 0 0 0  Down, Depressed, Hopeless 0 0 0 0 0  PHQ - 2 Score 0 0 0 0 0  Altered sleeping 0 0 0 0 0  Tired, decreased energy 0 0 0 0 0  Change in appetite 0 0 0 0 0  Feeling bad or failure about yourself  0 0 0 0 0  Trouble concentrating 0 0 0 1 0  Moving slowly or fidgety/restless 0 0 0 0 0  Suicidal thoughts 0 0 0 0 0  PHQ-9 Score 0 0 0 1 0  Difficult doing work/chores Not difficult at all   Not difficult at all Not difficult at all    Hypertension:  BP Readings from Last 3 Encounters:  07/27/22 117/81  03/23/22 (!) 135/102  07/27/21 132/84    Obesity: Wt Readings from Last 3  Encounters:  07/27/22 167 lb 6.4 oz (75.9 kg)  03/23/22 165 lb (74.8 kg)  07/27/21 170 lb 12.8 oz (77.5 kg)   BMI Readings from Last 3 Encounters:  07/27/22 22.70 kg/m  03/23/22 22.38 kg/m  07/27/21 23.82 kg/m     Lipids:  Lab Results  Component Value Date   CHOL 141 08/18/2020   CHOL 236 (H) 12/14/2019   CHOL 175 03/24/2018   Lab Results  Component Value Date   HDL 66 08/18/2020   HDL 75 12/14/2019   HDL 60 03/24/2018   Lab Results  Component Value Date   LDLCALC 62 08/18/2020   LDLCALC 138 (H) 12/14/2019   LDLCALC 98 03/24/2018   Lab Results  Component Value Date   TRIG 66 08/18/2020   TRIG 111 12/14/2019   TRIG 83 03/24/2018   Lab Results  Component Value Date   CHOLHDL 2.1 08/18/2020   CHOLHDL 3.1 12/14/2019   CHOLHDL 2.9 03/24/2018   No results found for: "LDLDIRECT" Glucose:  Glucose, Bld  Date Value Ref Range Status  05/01/2021 112 (H) 65 - 99 mg/dL Final  Comment:    .            Fasting reference interval . For someone without known diabetes, a glucose value between 100 and 125 mg/dL is consistent with prediabetes and should be confirmed with a follow-up test. .   12/14/2019 84 65 - 99 mg/dL Final    Comment:    .            Fasting reference interval .   03/24/2018 80 65 - 99 mg/dL Final    Comment:    .            Fasting reference interval .     Flowsheet Row Office Visit from 05/01/2021 in [facility]  AUDIT-C Score 1       Married STD testing and prevention (HIV/chl/gon/syphilis): N/A Sexual history:  Hep C Screening: 01/08/13 Skin cancer: Discussed monitoring for atypical lesions Colorectal cancer: 07/27/22 Prostate cancer:   Lab Results  Component Value Date   PSA 1.35 05/01/2021   PSA 1.6 12/14/2019   PSA 1.5 03/24/2018     Lung cancer:  Low Dose CT Chest recommended if Age 7-80 years, 30 pack-year currently smoking OR have quit w/in 15years. Patient  no a candidate for screening    AAA: The USPSTF recommends one-time screening with ultrasonography in men ages 4 to 75 years who have ever smoked. Patient   no, a candidate for screening  ECG:  05/09/20  Vaccines:   HPV:N/A Tdap: up to date Shingrix: up to date Pneumonia: Pneumo-23 11/28/15 Flu: up to date COVID-19: up to date  Advanced Care Planning: A voluntary discussion about advance care planning including the explanation and discussion of advance directives.  Discussed health care proxy and Living will, and the patient was able to identify a health care proxy as ***.  Patient does not have a living will and power of attorney of health care   Patient Active Problem List   Diagnosis Date Noted   Aortic root dilation (HCC) 05/01/2021   BPH associated with nocturia 03/24/2018   Rectal polyp    Mild aortic regurgitation 11/26/2016   History of alcoholism (HCC) 04/17/2015   ED (erectile dysfunction) of non-organic origin 04/17/2015   Acid reflux 04/17/2015   Herniation of nucleus pulposus 04/17/2015   Bilateral inguinal hernia 04/17/2015   H/O malignant neoplasm of skin 04/17/2015   Bicuspid aortic valve 04/17/2015   HLD (hyperlipidemia)    Hx of transient ischemic attack (TIA) 04/16/2015    Past Surgical History:  Procedure Laterality Date   COLONOSCOPY WITH PROPOFOL N/A 03/29/2017   Procedure: COLONOSCOPY WITH PROPOFOL;  Surgeon: Midge Minium, MD;  Location: [facility] ENDOSCOPY;  Service: Endoscopy;  Laterality: N/A;   COLONOSCOPY WITH PROPOFOL N/A 07/27/2022   Procedure: COLONOSCOPY WITH PROPOFOL;  Surgeon: Midge Minium, MD;  Location: [facility] ENDOSCOPY;  Service: Endoscopy;  Laterality: N/A;   KNEE SURGERY Left 2005    Family History  Problem Relation Age of Onset   Cancer Father        Stomach    Heart disease Father    Diabetes Maternal Grandfather    Alcohol abuse Paternal Uncle    Obesity Brother     Social History   Socioeconomic History   Marital status: Married    Spouse name: Herbert Seta    Number of children: 0   Years of education: Not on file   Highest education level: Some college, no degree  Occupational History   Not on file  Tobacco Use   Smoking status: Former    Types: Pipe    Start date: 08/10/2003    Quit date: 08/09/2013    Years since quitting: 9.0   Smokeless tobacco: Never  Vaping Use   Vaping Use: Never used  Substance and Sexual Activity   Alcohol use: Yes    Alcohol/week: 1.0 standard drink of alcohol    Types: 1 Glasses of wine per week    Comment: wine with dinner very occasionally   Drug use: No   Sexual activity: Yes    Partners: Female  Other Topics Concern   Not on file  Social History Narrative   Not on file   Social Determinants of Health   Financial Resource Strain: Low Risk  (12/14/2019)   Overall Financial Resource Strain (CARDIA)    Difficulty of Paying Living Expenses: Not hard at all  Food Insecurity: No Food Insecurity (12/14/2019)   Hunger Vital Sign    Worried About Running Out of Food in the Last Year: Never true    [place] in the Last Year: Never true  Transportation Needs: No Transportation Needs (12/14/2019)   PRAPARE - Hydrologist (Medical): No    Lack of Transportation (Non-Medical): No  Physical Activity: Inactive (12/14/2019)   Exercise Vital Sign    Days of Exercise per Week: 0 days    Minutes of Exercise per Session: 0 min  Stress: No Stress Concern Present (12/14/2019)   [place]    Feeling of Stress : Not at all  Social Connections: Moderately Integrated (12/14/2019)   Social Connection and Isolation Panel [NHANES]    Frequency of Communication with Friends and Family: Twice a week    Frequency of Social Gatherings with Friends and Family: Twice a week    Attends Religious Services: Never    Marine scientist or Organizations: Yes    Attends Music therapist: More than 4 times per year    Marital  Status: Married  Human resources officer Violence: Not At Risk (12/14/2019)   Humiliation, Afraid, Rape, and Kick questionnaire    Fear of Current or Ex-Partner: No    Emotionally Abused: No    Physically Abused: No    Sexually Abused: No     Current Outpatient Medications:    atorvastatin (LIPITOR) 80 MG tablet, TAKE ONE TABLET BY MOUTH ONE TIME DAILY, Disp: 30 tablet, Rfl: 3   clopidogrel (PLAVIX) 75 MG tablet, Take 1 tablet by mouth daily., Disp: , Rfl:    clopidogrel (PLAVIX) 75 MG tablet, TAKE ONE TABLET BY MOUTH ONE TIME DAILY, Disp: 56 tablet, Rfl: 0   diphenhydrAMINE (BENADRYL) 12.5 MG/5ML elixir, Take by mouth., Disp: , Rfl:    ezetimibe (ZETIA) 10 MG tablet, TAKE ONE TABLET BY MOUTH ONE TIME DAILY, APPOINTMENT NEEDED FOR REFILLS, Disp: 90 tablet, Rfl: 0   famotidine (PEPCID) 40 MG tablet, TAKE ONE TABLET BY MOUTH AT BEDTIME, Disp: 90 tablet, Rfl: 0   fluticasone (FLONASE) 50 MCG/ACT nasal spray, USE TWO SPRAYS IN THE AFFECTED NOSTRIL ONE TIME DAILY, Disp: 16 mL, Rfl: 0   fluticasone (FLONASE) 50 MCG/ACT nasal spray, Administer 2 sprays in each nostril daily., Disp: , Rfl:    permethrin (ELIMITE) 5 % cream, Apply topically. (Patient not taking: Reported on 07/27/2021), Disp: , Rfl:    sildenafil (VIAGRA) 100 MG tablet, TAKE ONE TABLET BY MOUTH DAILY AS NEEDED FOR ERECTILE DYSFUNCTION, Disp:  30 tablet, Rfl: 1  Allergies  Allergen Reactions   Penicillins Hives and Rash     ROS  ***   Objective  There were no vitals filed for this visit.  There is no height or weight on file to calculate BMI.  Physical Exam ***  No results found for this or any previous visit (from the past 2160 hour(s)).   Fall Risk:    07/27/2021    3:49 PM 05/01/2021    2:39 PM 12/14/2019   10:48 AM 11/10/2018    8:51 AM 03/24/2018   10:33 AM  Fall Risk   Falls in the past year? 0 0 0 0 No  Number falls in past yr: 0  0 0   Injury with Fall? 0  0 0   Follow up  Falls prevention discussed         Functional Status Survey:      Assessment & Plan  1. Well adult exam ***    -Prostate cancer screening and PSA options (with potential risks and benefits of testing vs not testing) were discussed along with recent recs/guidelines. -USPSTF grade A and B recommendations reviewed with patient; age-appropriate recommendations, preventive care, screening tests, etc discussed and encouraged; healthy living encouraged; see AVS for patient education given to patient -Discussed importance of 150 minutes of physical activity weekly, eat two servings of fish weekly, eat one serving of tree nuts ( cashews, pistachios, pecans, almonds.Marland Kitchen) every other day, eat 6 servings of fruit/vegetables daily and drink plenty of water and avoid sweet beverages.  -Reviewed Health Maintenance: yes

## 2022-08-11 NOTE — Patient Instructions (Incomplete)
Preventive Care 65 Years and Older, Male Preventive care refers to lifestyle choices and visits with your health care provider that can promote health and wellness. Preventive care visits are also called wellness exams. What can I expect for my preventive care visit? Counseling During your preventive care visit, your health care provider may ask about your: Medical history, including: Past medical problems. Family medical history. History of falls. Current health, including: Emotional well-being. Home life and relationship well-being. Sexual activity. Memory and ability to understand (cognition). Lifestyle, including: Alcohol, nicotine or tobacco, and drug use. Access to firearms. Diet, exercise, and sleep habits. Work and work environment. Sunscreen use. Safety issues such as seatbelt and bike helmet use. Physical exam Your health care provider will check your: Height and weight. These may be used to calculate your BMI (body mass index). BMI is a measurement that tells if you are at a healthy weight. Waist circumference. This measures the distance around your waistline. This measurement also tells if you are at a healthy weight and may help predict your risk of certain diseases, such as type 2 diabetes and high blood pressure. Heart rate and blood pressure. Body temperature. Skin for abnormal spots. What immunizations do I need?  Vaccines are usually given at various ages, according to a schedule. Your health care provider will recommend vaccines for you based on your age, medical history, and lifestyle or other factors, such as travel or where you work. What tests do I need? Screening Your health care provider may recommend screening tests for certain conditions. This may include: Lipid and cholesterol levels. Diabetes screening. This is done by checking your blood sugar (glucose) after you have not eaten for a while (fasting). Hepatitis C test. Hepatitis B test. HIV (human  immunodeficiency virus) test. STI (sexually transmitted infection) testing, if you are at risk. Lung cancer screening. Colorectal cancer screening. Prostate cancer screening. Abdominal aortic aneurysm (AAA) screening. You may need this if you are a current or former smoker. Talk with your health care provider about your test results, treatment options, and if necessary, the need for more tests. Follow these instructions at home: Eating and drinking  Eat a diet that includes fresh fruits and vegetables, whole grains, lean protein, and low-fat dairy products. Limit your intake of foods with high amounts of sugar, saturated fats, and salt. Take vitamin and mineral supplements as recommended by your health care provider. Do not drink alcohol if your health care provider tells you not to drink. If you drink alcohol: Limit how much you have to 0-2 drinks a day. Know how much alcohol is in your drink. In the [place] one drink equals one 12 oz bottle of beer (355 mL), one 5 oz glass of wine (148 mL), or one 1 oz glass of hard liquor (44 mL). Lifestyle Brush your teeth every morning and night with fluoride toothpaste. Floss one time each day. Exercise for at least 30 minutes 5 or more days each week. Do not use any products that contain nicotine or tobacco. These products include cigarettes, chewing tobacco, and vaping devices, such as e-cigarettes. If you need help quitting, ask your health care provider. Do not use drugs. If you are sexually active, practice safe sex. Use a condom or other form of protection to prevent STIs. Take aspirin only as told by your health care provider. Make sure that you understand how much to take and what form to take. Work with your health care provider to find out whether it is safe   and beneficial for you to take aspirin daily. Ask your health care provider if you need to take a cholesterol-lowering medicine (statin). Find healthy ways to manage stress, such  as: Meditation, yoga, or listening to music. Journaling. Talking to a trusted person. Spending time with friends and family. Safety Always wear your seat belt while driving or riding in a vehicle. Do not drive: If you have been drinking alcohol. Do not ride with someone who has been drinking. When you are tired or distracted. While texting. If you have been using any mind-altering substances or drugs. Wear a helmet and other protective equipment during sports activities. If you have firearms in your house, make sure you follow all gun safety procedures. Minimize exposure to UV radiation to reduce your risk of skin cancer. What's next? Visit your health care provider once a year for an annual wellness visit. Ask your health care provider how often you should have your eyes and teeth checked. Stay up to date on all vaccines. This information is not intended to replace advice given to you by your health care provider. Make sure you discuss any questions you have with your health care provider. Document Revised: 01/21/2021 Document Reviewed: 01/21/2021 Elsevier Patient Education  2023 Elsevier Inc.  

## 2022-08-12 ENCOUNTER — Encounter: Payer: BC Managed Care – PPO | Admitting: Family Medicine

## 2022-08-12 DIAGNOSIS — Z0000 Encounter for general adult medical examination without abnormal findings: Secondary | ICD-10-CM

## 2022-08-26 ENCOUNTER — Other Ambulatory Visit: Payer: Self-pay

## 2022-08-26 DIAGNOSIS — K219 Gastro-esophageal reflux disease without esophagitis: Secondary | ICD-10-CM

## 2022-08-30 ENCOUNTER — Telehealth: Payer: Self-pay

## 2022-08-30 DIAGNOSIS — K219 Gastro-esophageal reflux disease without esophagitis: Secondary | ICD-10-CM

## 2022-08-31 NOTE — Telephone Encounter (Signed)
Appt sch'd for 1.29.2024 with Dr Ancil Boozer

## 2022-09-03 NOTE — Progress Notes (Unsigned)
Name: Raymond Chambers   MRN: [id number]    DOB: 1956/02/01   Date:09/06/2022       Progress Note  Subjective  Chief Complaint  Medication Refill  HPI  Hyperlipidemia: taking Atorvastatin as prescribed, he states leg cramps have improved , we will recheck comp panel today    HTN: he used to take lisinopril but not currently because of drop of BP and some dizziness, bp today is not great, we will resume lisinopril at lower dose    TIA: he was at work on 04/2015 and developed acute onset of dizziness. Followed by left facial tingling, left leg and arm tingling and heaviness, the episode lasted about 2 minutes . He went to [facility] and transferred to [facility], he had multiple tests done, abnormal findings ( stable mild apical pleural thickening and bicuspid Aortic Valve, increase in in subcortical hyper intensities slightly greater than expected for age) and elevated LDL.  He has been out of Atorvastatin but still taking Zetia and plavix. WE will refill medication and recheck labs next visit    History of alcoholism: used to be a heavy drinker, currently only drinking one glass of wine a couple of times a month Unchanged    ED: he is able to have an erection but unable to maintain, doing well on viagra . He needs a refill    GERD: he has a long history of GERD but lately symptoms have been getting worse, he had a colonoscopy in Dec but did not discuss it with GI. He states he drinks 32 oz of coffee per day . Avoid very hot or very cold liquids. He states symptoms more during the day, not after he eats. 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 Lenise Herald oredered a CT angio that was done last June and it was stable  MPRESSION:01/2022  Grossly stable 4.4 cm ascending thoracic aortic aneurysm. Recommend annual imaging followup by CTA or MRA   Patient Active Problem List   Diagnosis Date Noted   Aortic root dilation ([place]) 05/01/2021   BPH  associated with nocturia 03/24/2018   Rectal polyp    Mild aortic regurgitation 11/26/2016   History of alcoholism ([place]) 04/17/2015   ED (erectile dysfunction) of non-organic origin 04/17/2015   Acid reflux 04/17/2015   Herniation of nucleus pulposus 04/17/2015   Bilateral inguinal hernia 04/17/2015   H/O malignant neoplasm of skin 04/17/2015   Bicuspid aortic valve 04/17/2015   HLD (hyperlipidemia)    Hx of transient ischemic attack (TIA) 04/16/2015    Past Surgical History:  Procedure Laterality Date   COLONOSCOPY WITH PROPOFOL N/A 03/29/2017   Procedure: COLONOSCOPY WITH PROPOFOL;  Surgeon: Lucilla Lame, MD;  Location: [facility] ENDOSCOPY;  Service: Endoscopy;  Laterality: N/A;   COLONOSCOPY WITH PROPOFOL N/A 07/27/2022   Procedure: COLONOSCOPY WITH PROPOFOL;  Surgeon: Lucilla Lame, MD;  Location: [facility] ENDOSCOPY;  Service: Endoscopy;  Laterality: N/A;   KNEE SURGERY Left 2005    Family History  Problem Relation Age of Onset   Cancer Father        Stomach    Heart disease Father    Diabetes Maternal Grandfather    Alcohol abuse Paternal Uncle    Obesity Brother     Social History   Tobacco Use   Smoking status: Former    Types: Pipe    Start date: 08/10/2003    Quit date: 08/09/2013    Years since quitting: 9.0  Smokeless tobacco: Never  Substance Use Topics   Alcohol use: Yes    Alcohol/week: 1.0 standard drink of alcohol    Types: 1 Glasses of wine per week    Comment: wine with dinner very occasionally     Current Outpatient Medications:    atorvastatin (LIPITOR) 80 MG tablet, TAKE ONE TABLET BY MOUTH ONE TIME DAILY, Disp: 30 tablet, Rfl: 3   clopidogrel (PLAVIX) 75 MG tablet, TAKE ONE TABLET BY MOUTH ONE TIME DAILY, Disp: 56 tablet, Rfl: 0   diphenhydrAMINE (BENADRYL) 12.5 MG/5ML elixir, Take by mouth., Disp: , Rfl:    ezetimibe (ZETIA) 10 MG tablet, TAKE ONE TABLET BY MOUTH ONE TIME DAILY, APPOINTMENT NEEDED FOR REFILLS, Disp: 90 tablet, Rfl: 0   famotidine  (PEPCID) 40 MG tablet, TAKE ONE TABLET BY MOUTH AT BEDTIME, Disp: 90 tablet, Rfl: 0   fluticasone (FLONASE) 50 MCG/ACT nasal spray, USE TWO SPRAYS IN THE AFFECTED NOSTRIL ONE TIME DAILY, Disp: 16 mL, Rfl: 0   permethrin (ELIMITE) 5 % cream, Apply topically., Disp: , Rfl:    sildenafil (VIAGRA) 100 MG tablet, TAKE ONE TABLET BY MOUTH DAILY AS NEEDED FOR ERECTILE DYSFUNCTION, Disp: 30 tablet, Rfl: 1  Allergies  Allergen Reactions   Penicillins Hives and Rash    I personally reviewed active problem list, medication list, allergies, family history, social history, health maintenance with the patient/caregiver today.   ROS  Constitutional: Negative for fever or weight change.  Respiratory: Negative for cough and shortness of breath.   Cardiovascular: Negative for chest pain or palpitations.  Gastrointestinal: Negative for abdominal pain, no bowel changes.  Musculoskeletal: Negative for gait problem or joint swelling.  Skin: Negative for rash.  Neurological: Negative for dizziness or headache.  No other specific complaints in a complete review of systems (except as listed in HPI above).   Objective  Vitals:   09/06/22 1352  BP: 138/84  Pulse: 96  Resp: 16  SpO2: 99%  Weight: 170 lb (77.1 kg)  Height: 6' (1.829 m)    Body mass index is 23.06 kg/m.  Physical Exam  Constitutional: Patient appears well-developed and well-nourished.  No distress.  HEENT: head atraumatic, normocephalic, pupils equal and reactive to light, neck supple Cardiovascular: Normal rate, regular rhythm and normal heart sounds.  No murmur heard. No BLE edema. Pulmonary/Chest: Effort normal and breath sounds normal. No respiratory distress. Abdominal: Soft.  There is no tenderness. Psychiatric: Patient has a normal mood and affect. behavior is normal. Judgment and thought content normal.   PHQ2/9:    09/06/2022    1:51 PM 07/27/2021    3:49 PM 05/01/2021    2:49 PM 12/14/2019   10:48 AM 11/10/2018    8:53  AM  Depression screen PHQ 2/9  Decreased Interest 0 0 0 0 0  Down, Depressed, Hopeless 0 0 0 0 0  PHQ - 2 Score 0 0 0 0 0  Altered sleeping 0 0 0 0 0  Tired, decreased energy 0 0 0 0 0  Change in appetite 0 0 0 0 0  Feeling bad or failure about yourself  0 0 0 0 0  Trouble concentrating 0 0 0 0 1  Moving slowly or fidgety/restless 0 0 0 0 0  Suicidal thoughts 0 0 0 0 0  PHQ-9 Score 0 0 0 0 1  Difficult doing work/chores  Not difficult at all   Not difficult at all    phq 9 is negative   Fall Risk:  09/06/2022    1:51 PM 07/27/2021    3:49 PM 05/01/2021    2:39 PM 12/14/2019   10:48 AM 11/10/2018    8:51 AM  Fall Risk   Falls in the past year? 0 0 0 0 0  Number falls in past yr: 0 0  0 0  Injury with Fall? 0 0  0 0  Risk for fall due to : No Fall Risks      Follow up Falls prevention discussed  Falls prevention discussed        Functional Status Survey: Is the patient deaf or have difficulty hearing?: No Does the patient have difficulty seeing, even when wearing glasses/contacts?: No Does the patient have difficulty concentrating, remembering, or making decisions?: No Does the patient have difficulty walking or climbing stairs?: No Does the patient have difficulty dressing or bathing?: No Does the patient have difficulty doing errands alone such as visiting a doctor's office or shopping?: No    Assessment & Plan  1. Senile purpura ([place])  Reassurance given again   2. History of alcoholism ([place])  In remission   3. Aortic dilatation ([facility])  Under the care of Dr. Lenise Herald   4. Diverticulosis   5. Need for pneumococcal vaccine  - Pneumococcal conjugate vaccine 20-valent (Prevnar 20)  6. Grade I internal hemorrhoids   7. Hypertension, benign  - lisinopril (ZESTRIL) 5 MG tablet; Take 1 tablet (5 mg total) by mouth daily.  Dispense: 30 tablet; Refill: 3  8. Moderate aortic regurgitation   9. Pure hypercholesterolemia  - atorvastatin (LIPITOR) 80 MG  tablet; Take 1 tablet (80 mg total) by mouth daily.  Dispense: 30 tablet; Refill: 3  10. Hx of transient ischemic attack (TIA)  - atorvastatin (LIPITOR) 80 MG tablet; Take 1 tablet (80 mg total) by mouth daily.  Dispense: 30 tablet; Refill: 3 - clopidogrel (PLAVIX) 75 MG tablet; Take 1 tablet (75 mg total) by mouth daily.  Dispense: 30 tablet; Refill: 3 - ezetimibe (ZETIA) 10 MG tablet; Take 1 tablet (10 mg total) by mouth daily.  Dispense: 30 tablet; Refill: 3  11. BPH associated with nocturia   12. Other male erectile dysfunction  - sildenafil (VIAGRA) 100 MG tablet; TAKE ONE TABLET BY MOUTH DAILY AS NEEDED FOR ERECTILE DYSFUNCTION  Dispense: 30 tablet; Refill: 1  13. GERD without esophagitis  - pantoprazole (PROTONIX) 40 MG tablet; Take 1 tablet (40 mg total) by mouth daily before breakfast.  Dispense: 30 tablet; Refill: 3

## 2022-09-06 ENCOUNTER — Encounter: Payer: Self-pay | Admitting: Family Medicine

## 2022-09-06 ENCOUNTER — Ambulatory Visit: Payer: BC Managed Care – PPO | Admitting: Family Medicine

## 2022-09-06 VITALS — BP 138/84 | HR 96 | Resp 16 | Ht 72.0 in | Wt 170.0 lb

## 2022-09-06 DIAGNOSIS — I77819 Aortic ectasia, unspecified site: Secondary | ICD-10-CM

## 2022-09-06 DIAGNOSIS — K219 Gastro-esophageal reflux disease without esophagitis: Secondary | ICD-10-CM

## 2022-09-06 DIAGNOSIS — R351 Nocturia: Secondary | ICD-10-CM

## 2022-09-06 DIAGNOSIS — N401 Enlarged prostate with lower urinary tract symptoms: Secondary | ICD-10-CM

## 2022-09-06 DIAGNOSIS — Z8673 Personal history of transient ischemic attack (TIA), and cerebral infarction without residual deficits: Secondary | ICD-10-CM

## 2022-09-06 DIAGNOSIS — K5790 Diverticulosis of intestine, part unspecified, without perforation or abscess without bleeding: Secondary | ICD-10-CM | POA: Diagnosis not present

## 2022-09-06 DIAGNOSIS — I351 Nonrheumatic aortic (valve) insufficiency: Secondary | ICD-10-CM

## 2022-09-06 DIAGNOSIS — Z23 Encounter for immunization: Secondary | ICD-10-CM

## 2022-09-06 DIAGNOSIS — N528 Other male erectile dysfunction: Secondary | ICD-10-CM

## 2022-09-06 DIAGNOSIS — I10 Essential (primary) hypertension: Secondary | ICD-10-CM

## 2022-09-06 DIAGNOSIS — K640 First degree hemorrhoids: Secondary | ICD-10-CM

## 2022-09-06 DIAGNOSIS — E7800 Pure hypercholesterolemia, unspecified: Secondary | ICD-10-CM

## 2022-09-06 DIAGNOSIS — D692 Other nonthrombocytopenic purpura: Secondary | ICD-10-CM | POA: Insufficient documentation

## 2022-09-06 DIAGNOSIS — F1021 Alcohol dependence, in remission: Secondary | ICD-10-CM

## 2022-09-06 MED ORDER — LISINOPRIL 5 MG PO TABS: 5.0000 mg | ORAL_TABLET | Freq: Every day | ORAL | 3 refills | Status: DC

## 2022-09-06 MED ORDER — ATORVASTATIN CALCIUM 80 MG PO TABS: 80.0000 mg | ORAL_TABLET | Freq: Every day | ORAL | 3 refills | Status: DC

## 2022-09-06 MED ORDER — CLOPIDOGREL BISULFATE 75 MG PO TABS: 75.0000 mg | ORAL_TABLET | Freq: Every day | ORAL | 3 refills | Status: DC

## 2022-09-06 MED ORDER — EZETIMIBE 10 MG PO TABS: 10.0000 mg | ORAL_TABLET | Freq: Every day | ORAL | 3 refills | Status: DC

## 2022-09-06 MED ORDER — SILDENAFIL CITRATE 100 MG PO TABS: ORAL_TABLET | ORAL | 1 refills | Status: DC

## 2022-09-06 MED ORDER — PANTOPRAZOLE SODIUM 40 MG PO TBEC: 40.0000 mg | DELAYED_RELEASE_TABLET | Freq: Every day | ORAL | 3 refills | Status: DC

## 2022-10-25 ENCOUNTER — Other Ambulatory Visit: Payer: Self-pay | Admitting: Family Medicine

## 2022-10-25 DIAGNOSIS — Z8673 Personal history of transient ischemic attack (TIA), and cerebral infarction without residual deficits: Secondary | ICD-10-CM

## 2022-11-05 ENCOUNTER — Other Ambulatory Visit: Payer: Self-pay | Admitting: Family Medicine

## 2022-11-05 DIAGNOSIS — N528 Other male erectile dysfunction: Secondary | ICD-10-CM

## 2022-11-08 ENCOUNTER — Other Ambulatory Visit: Payer: Self-pay

## 2022-11-08 DIAGNOSIS — N528 Other male erectile dysfunction: Secondary | ICD-10-CM

## 2022-11-08 MED ORDER — SILDENAFIL CITRATE 100 MG PO TABS: ORAL_TABLET | ORAL | 0 refills | Status: DC

## 2022-11-08 NOTE — Telephone Encounter (Signed)
Requested medications are due for refill today.  yes  Requested medications are on the active medications list.  yes  Last refill. 09/06/2022  Future visit scheduled.   yes  Notes to clinic.  Labs are expired.    Requested Prescriptions  Pending Prescriptions Disp Refills   sildenafil (VIAGRA) 100 MG tablet [Pharmacy Med Name: SILDENAFIL 100 MG TABLET] 30 tablet 1    Sig: TAKE 1 TABLET BY MOUTH DAILY AS NEEDED FOR ERECTILE DYSFUNCTION     Urology: Erectile Dysfunction Agents Failed - 11/05/2022  5:52 PM      Failed - AST in normal range and within 360 days    AST  Date Value Ref Range Status  05/01/2021 21 10 - 35 U/L Final         Failed - ALT in normal range and within 360 days    ALT  Date Value Ref Range Status  05/01/2021 17 9 - 46 U/L Final         Passed - Last BP in normal range    BP Readings from Last 1 Encounters:  09/06/22 138/84         Passed - Valid encounter within last 12 months    Recent Outpatient Visits           2 months ago Senile purpura [facility])   [facility] Steele Sizer, MD   1 year ago [place], DO   1 year ago Moderate aortic regurgitation   [facility] Steele Sizer, MD   2 years ago Senile purpura [facility])   [facility] Steele Sizer, MD   3 years ago Pure hypercholesterolemia   [facility] Steele Sizer, MD       Future Appointments             In 2 months Ancil Boozer, Drue Stager, MD [facility], [facility]

## 2022-12-20 ENCOUNTER — Other Ambulatory Visit: Payer: Self-pay | Admitting: Internal Medicine

## 2022-12-20 DIAGNOSIS — E7800 Pure hypercholesterolemia, unspecified: Secondary | ICD-10-CM

## 2022-12-20 DIAGNOSIS — Z8673 Personal history of transient ischemic attack (TIA), and cerebral infarction without residual deficits: Secondary | ICD-10-CM

## 2022-12-21 NOTE — Telephone Encounter (Signed)
Requested Prescriptions  Pending Prescriptions Disp Refills   atorvastatin (LIPITOR) 80 MG tablet [Pharmacy Med Name: ATORVASTATIN 80 MG TAB[*]] 30 tablet 3    Sig: TAKE ONE TABLET BY MOUTH ONE TIME DAILY     Cardiovascular:  Antilipid - Statins Failed - 12/20/2022  8:47 PM      Failed - Lipid Panel in normal range within the last 12 months    Cholesterol, Total  Date Value Ref Range Status  08/18/2020 141 100 - 199 mg/dL Final   LDL Cholesterol (Calc)  Date Value Ref Range Status  12/14/2019 138 (H) mg/dL (calc) Final    Comment:    Reference range: <100 . Desirable range <100 mg/dL for primary prevention;   <70 mg/dL for patients with CHD or diabetic patients  with > or = 2 CHD risk factors. Marland Kitchen LDL-C is now calculated using the Martin-Hopkins  calculation, which is a validated novel method providing  better accuracy than the Friedewald equation in the  estimation of LDL-C.  Horald Pollen et al. Lenox Ahr. 9629;528(41): 2061-2068  (http://education.[employer].com/faq/FAQ164)    LDL Chol Calc (NIH)  Date Value Ref Range Status  08/18/2020 62 0 - 99 mg/dL Final   HDL  Date Value Ref Range Status  08/18/2020 66 >39 mg/dL Final   Triglycerides  Date Value Ref Range Status  08/18/2020 66 0 - 149 mg/dL Final         Passed - Patient is not pregnant      Passed - Valid encounter within last 12 months    Recent Outpatient Visits           3 months ago Senile purpura [facility]   [facility] [place] [facility] Alba Cory, MD   1 year ago [place]   [facility] Margarita Mail, DO   1 year ago Moderate aortic regurgitation   [facility] Alba Cory, MD   3 years ago Senile purpura [facility])   [facility] Alba Cory, MD   4 years ago Pure hypercholesterolemia   [facility] Alba Cory, MD       Future Appointments             In 1 month  Carlynn Purl, Danna Hefty, MD [facility]

## 2023-01-18 ENCOUNTER — Encounter: Payer: BC Managed Care – PPO | Admitting: Family Medicine

## 2023-02-02 NOTE — Progress Notes (Unsigned)
Name: Raymond Chambers   MRN: [id number]    DOB: [birth year]   Date:02/03/2023       Progress Note  Subjective  Chief Complaint  Annual Exam  HPI  Patient presents for annual CPE.  IPSS Questionnaire (AUA-7): Over the past month.   1)  How often have you had a sensation of not emptying your bladder completely after you finish urinating?  2 - Less than half the time  2)  How often have you had to urinate again less than two hours after you finished urinating? 0 - Not at all  3)  How often have you found you stopped and started again several times when you urinated?  0 - Not at all  4) How difficult have you found it to postpone urination?  0 - Not at all  5) How often have you had a weak urinary stream?  0 - Not at all  6) How often have you had to push or strain to begin urination?  0 - Not at all  7) How many times did you most typically get up to urinate from the time you went to bed until the time you got up in the morning?  2 - 2 times  Total score:  0-7 mildly symptomatic   8-19 moderately symptomatic   20-35 severely symptomatic     Diet: cooking at home, eats fruit and vegetables, eating too many carbohydrates  Exercise: continue regular physical activity  Last Dental Exam: up to date  Last Eye Exam: up to date   Depression: phq 9 is negative    02/03/2023    7:45 AM 09/06/2022    1:51 PM 07/27/2021    3:49 PM 05/01/2021    2:49 PM 12/14/2019   10:48 AM  Depression screen PHQ 2/9  Decreased Interest 0 0 0 0 0  Down, Depressed, Hopeless 1 0 0 0 0  PHQ - 2 Score 1 0 0 0 0  Altered sleeping 0 0 0 0 0  Tired, decreased energy 0 0 0 0 0  Change in appetite 0 0 0 0 0  Feeling bad or failure about yourself  0 0 0 0 0  Trouble concentrating 0 0 0 0 0  Moving slowly or fidgety/restless 0 0 0 0 0  Suicidal thoughts 0 0 0 0 0  PHQ-9 Score 1 0 0 0 0  Difficult doing work/chores   Not difficult at all      Hypertension:  BP Readings from Last 3 Encounters:  02/03/23 132/80   09/06/22 138/84  07/27/22 117/81    Obesity: Wt Readings from Last 3 Encounters:  02/03/23 166 lb (75.3 kg)  09/06/22 170 lb (77.1 kg)  07/27/22 167 lb 6.4 oz (75.9 kg)   BMI Readings from Last 3 Encounters:  02/03/23 22.51 kg/m  09/06/22 23.06 kg/m  07/27/22 22.70 kg/m     Lipids:  Lab Results  Component Value Date   CHOL 141 08/18/2020   CHOL 236 (H) 12/14/2019   CHOL 175 03/24/2018   Lab Results  Component Value Date   HDL 66 08/18/2020   HDL 75 12/14/2019   HDL 60 03/24/2018   Lab Results  Component Value Date   LDLCALC 62 08/18/2020   LDLCALC 138 (H) 12/14/2019   LDLCALC 98 03/24/2018   Lab Results  Component Value Date   TRIG 66 08/18/2020   TRIG 111 12/14/2019   TRIG 83 03/24/2018   Lab Results  Component Value Date  CHOLHDL 2.1 08/18/2020   CHOLHDL 3.1 12/14/2019   CHOLHDL 2.9 03/24/2018   No results found for: "LDLDIRECT" Glucose:  Glucose, Bld  Date Value Ref Range Status  05/01/2021 112 (H) 65 - 99 mg/dL Final    Comment:    .            Fasting reference interval . For someone without known diabetes, a glucose value between 100 and 125 mg/dL is consistent with prediabetes and should be confirmed with a follow-up test. .   12/14/2019 84 65 - 99 mg/dL Final    Comment:    .            Fasting reference interval .   03/24/2018 80 65 - 99 mg/dL Final    Comment:    .            Fasting reference interval .     Flowsheet Row Office Visit from 05/01/2021 in [facility]  AUDIT-C Score 1       Married STD testing and prevention (HIV/chl/gon/syphilis): N/A Sexual history: married, one partner - using viagra and it does not seem to be working as well . He has normal libido  Hep C Screening: 01/08/13 Skin cancer: Discussed monitoring for atypical lesions Colorectal cancer: 07/27/22 Prostate cancer: we will recheck PSA he has BPH Lab Results  Component Value Date   PSA 1.35 05/01/2021   PSA 1.6  12/14/2019   PSA 1.5 03/24/2018     Lung cancer:  Low Dose CT Chest recommended if Age 66-80 years, 30 pack-year currently smoking OR have quit w/in 15years. Patient  not a candidate for screening   AAA: under the care of Dr. Rennis Golden - he has aneursym of ascending thoracic aorta , we will order scan abdominal aorta  ECG:  05/09/20  Vaccines:   HPV: N/A Tdap: up to date Shingrix: up to date Pneumonia: up to date Flu: up to date COVID-19: up to date  Advanced Care Planning: A voluntary discussion about advance care planning including the explanation and discussion of advance directives.  Discussed health care proxy and Living will, and the patient was able to identify a health care proxy as wife.  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   Acid reflux 04/17/2015   Herniation of nucleus pulposus 04/17/2015   Bilateral inguinal hernia 04/17/2015   H/O malignant neoplasm of skin 04/17/2015   Bicuspid aortic valve 04/17/2015   HLD (hyperlipidemia)    Hx of transient ischemic attack (TIA) 04/16/2015    Past Surgical History:  Procedure Laterality Date   COLONOSCOPY WITH PROPOFOL N/A 03/29/2017   Procedure: COLONOSCOPY WITH PROPOFOL;  Surgeon: Midge Minium, MD;  Location: [facility] ENDOSCOPY;  Service: Endoscopy;  Laterality: N/A;   COLONOSCOPY WITH PROPOFOL N/A 07/27/2022   Procedure: COLONOSCOPY WITH PROPOFOL;  Surgeon: Midge Minium, MD;  Location: [facility] ENDOSCOPY;  Service: Endoscopy;  Laterality: N/A;   KNEE SURGERY Left 2005    Family History  Problem Relation  Age of Onset   Cancer Father  Stomach    Heart disease Father    Diabetes Maternal Grandfather    Alcohol abuse Paternal Uncle    Obesity Brother     Social History   Socioeconomic History   Marital status: Married    Spouse name: Herbert Seta   Number of children: 0   Years of education: Not on file   Highest education level: Some college, no degree  Occupational History   Not on file  Tobacco Use   Smoking status: Former    Types: Pipe    Start date: 08/10/2003    Quit date: 08/09/2013    Years since quitting: 9.4   Smokeless tobacco: Never  Vaping Use   Vaping Use: Never used  Substance and Sexual Activity   Alcohol use: Yes    Alcohol/week: 1.0 standard drink of alcohol    Types: 1 Glasses of wine per week    Comment: wine with dinner very occasionally   Drug use: No   Sexual activity: Yes    Partners: Female  Other Topics Concern   Not on file  Social History Narrative   Not on file   Social Determinants of Health   Financial Resource Strain: Low Risk  (02/03/2023)   Overall Financial Resource Strain (CARDIA)    Difficulty of Paying Living Expenses: Not hard at all  Food Insecurity: No Food Insecurity (02/03/2023)   Hunger Vital Sign    Worried About Running Out of Food in the Last Year: Never true    Ran Out of Food in the Last Year: Never true  Transportation Needs: No Transportation Needs (02/03/2023)   PRAPARE - Administrator, Civil Service (Medical): No    Lack of Transportation (Non-Medical): No  Physical Activity: Sufficiently Active (02/03/2023)   Exercise Vital Sign    Days of Exercise per Week: 5 days    Minutes of Exercise per Session: 30 min  Stress: No Stress Concern Present (02/03/2023)   [employer] of Occupational Health - Occupational Stress Questionnaire    Feeling of Stress : Only a little  Social Connections: Moderately Isolated (02/03/2023)   Social Connection and Isolation Panel [NHANES]    Frequency of Communication  with Friends and Family: Once a week    Frequency of Social Gatherings with Friends and Family: Never    Attends Religious Services: Never    Database administrator or Organizations: Yes    Attends Engineer, structural: More than 4 times per year    Marital Status: Married  Catering manager Violence: Not At Risk (02/03/2023)   Humiliation, Afraid, Rape, and Kick questionnaire    Fear of Current or Ex-Partner: No    Emotionally Abused: No    Physically Abused: No    Sexually Abused: No     Current Outpatient Medications:    atorvastatin (LIPITOR) 80 MG tablet, TAKE ONE TABLET BY MOUTH ONE TIME DAILY, Disp: 30 tablet, Rfl: 3   clopidogrel (PLAVIX) 75 MG tablet, TAKE ONE TABLET BY MOUTH ONE TIME DAILY, Disp: 90 tablet, Rfl: 0   diphenhydrAMINE (BENADRYL) 12.5 MG/5ML elixir, Take by mouth., Disp: , Rfl:    ezetimibe (ZETIA) 10 MG tablet, Take 1 tablet (10 mg total) by mouth daily., Disp: 30 tablet, Rfl: 3   fluticasone (FLONASE) 50 MCG/ACT nasal spray, USE TWO SPRAYS IN THE AFFECTED NOSTRIL ONE TIME DAILY, Disp: 16 mL, Rfl: 0   lisinopril (ZESTRIL) 5 MG tablet, Take 1 tablet (5 mg total) by mouth daily., Disp: 30 tablet,  Rfl: 3   pantoprazole (PROTONIX) 40 MG tablet, Take 1 tablet (40 mg total) by mouth daily before breakfast., Disp: 30 tablet, Rfl: 3   sildenafil (VIAGRA) 100 MG tablet, TAKE ONE TABLET BY MOUTH DAILY AS NEEDED FOR ERECTILE DYSFUNCTION, Disp: 30 tablet, Rfl: 0  Allergies  Allergen Reactions   Penicillins Hives and Rash     ROS  Constitutional: Negative for fever or weight change.  Respiratory: Negative for cough and shortness of breath.   Cardiovascular: Negative for chest pain or palpitations.  Gastrointestinal: Negative for abdominal pain, no bowel changes.  Musculoskeletal: Negative for gait problem or joint swelling.  Skin: Negative for rash. Senile purpura on both arms  Neurological: Negative for dizziness or headache.  No other specific complaints in  a complete review of systems (except as listed in HPI above).    Objective  Vitals:   02/03/23 0756  BP: 132/80  Pulse: 67  Resp: 16  SpO2: 97%  Weight: 166 lb (75.3 kg)  Height: 6' (1.829 m)    Body mass index is 22.51 kg/m.  Physical Exam  Constitutional: Patient appears well-developed and well-nourished. No distress.  HENT: Head: Normocephalic and atraumatic. Ears: B TMs ok, no erythema or effusion; Nose: Nose normal. Mouth/Throat: Oropharynx is clear and moist. No oropharyngeal exudate.  Eyes: Conjunctivae and EOM are normal. Pupils are equal, round, and reactive to light. No scleral icterus.  Neck: Normal range of motion. Neck supple. No JVD present. No thyromegaly present.  Cardiovascular: Normal rate, regular rhythm and normal heart sounds.  No murmur heard. No BLE edema. Pulmonary/Chest: Effort normal and breath sounds normal. No respiratory distress. Abdominal: Soft. Bowel sounds are normal, no distension. There is no tenderness. no masses MALE GENITALIA: Normal descended testes bilaterally, no masses palpated, left inguinal hernia that is reducible , no lesions, no discharge RECTAL: Prostate is enlarged , no rectal masses or hemorrhoids  Musculoskeletal: Normal range of motion, no joint effusions. No gross deformities Neurological: he is alert and oriented to person, place, and time. No cranial nerve deficit. Coordination, balance, strength, speech and gait are normal.  Skin: Skin is warm and dry. No rash noted. Senile purpura on both arms  Psychiatric: Patient has a normal mood and affect. behavior is normal. Judgment and thought content normal.   Fall Risk:    02/03/2023    7:45 AM 09/06/2022    1:51 PM 07/27/2021    3:49 PM 05/01/2021    2:39 PM 12/14/2019   10:48 AM  Fall Risk   Falls in the past year? 0 0 0 0 0  Number falls in past yr: 0 0 0  0  Injury with Fall? 0 0 0  0  Risk for fall due to : No Fall Risks No Fall Risks     Follow up Falls prevention  discussed Falls prevention discussed  Falls prevention discussed      Functional Status Survey: Is the patient deaf or have difficulty hearing?: No Does the patient have difficulty seeing, even when wearing glasses/contacts?: No Does the patient have difficulty concentrating, remembering, or making decisions?: No Does the patient have difficulty walking or climbing stairs?: No Does the patient have difficulty dressing or bathing?: No Does the patient have difficulty doing errands alone such as visiting a doctor's office or shopping?: No    Assessment & Plan  1. Well adult exam  - Lipid panel - CBC with Differential/Platelet - COMPLETE METABOLIC PANEL WITH GFR - PSA  2. BPH with  obstruction/lower urinary tract symptoms  - PSA - Ambulatory referral to Urology  3. Pure hypercholesterolemia  - Lipid panel  4. Long-term use of high-risk medication  - CBC with Differential/Platelet - COMPLETE METABOLIC PANEL WITH GFR  5. ED (erectile dysfunction) of organic origin  - Ambulatory referral to Urology  6. History of tobacco use  - VAS [place] AAA DUPLEX; Future     -Prostate cancer screening and PSA options (with potential risks and benefits of testing vs not testing) were discussed along with recent recs/guidelines. -USPSTF grade A and B recommendations reviewed with patient; age-appropriate recommendations, preventive care, screening tests, etc discussed and encouraged; healthy living encouraged; see AVS for patient education given to patient -Discussed importance of 150 minutes of physical activity weekly, eat two servings of fish weekly, eat one serving of tree nuts ( cashews, pistachios, pecans, almonds.Marland Kitchen) every other day, eat 6 servings of fruit/vegetables daily and drink plenty of water and avoid sweet beverages.  -Reviewed Health Maintenance: yes

## 2023-02-03 ENCOUNTER — Other Ambulatory Visit: Payer: Self-pay

## 2023-02-03 ENCOUNTER — Encounter: Payer: Self-pay | Admitting: Family Medicine

## 2023-02-03 ENCOUNTER — Ambulatory Visit (INDEPENDENT_AMBULATORY_CARE_PROVIDER_SITE_OTHER): Payer: BC Managed Care – PPO | Admitting: Family Medicine

## 2023-02-03 VITALS — BP 132/80 | HR 67 | Resp 16 | Ht 72.0 in | Wt 166.0 lb

## 2023-02-03 DIAGNOSIS — K219 Gastro-esophageal reflux disease without esophagitis: Secondary | ICD-10-CM

## 2023-02-03 DIAGNOSIS — Z79899 Other long term (current) drug therapy: Secondary | ICD-10-CM

## 2023-02-03 DIAGNOSIS — N401 Enlarged prostate with lower urinary tract symptoms: Secondary | ICD-10-CM

## 2023-02-03 DIAGNOSIS — I10 Essential (primary) hypertension: Secondary | ICD-10-CM

## 2023-02-03 DIAGNOSIS — Z87891 Personal history of nicotine dependence: Secondary | ICD-10-CM

## 2023-02-03 DIAGNOSIS — Z0000 Encounter for general adult medical examination without abnormal findings: Secondary | ICD-10-CM | POA: Diagnosis not present

## 2023-02-03 DIAGNOSIS — E7800 Pure hypercholesterolemia, unspecified: Secondary | ICD-10-CM | POA: Diagnosis not present

## 2023-02-03 DIAGNOSIS — N529 Male erectile dysfunction, unspecified: Secondary | ICD-10-CM

## 2023-02-03 DIAGNOSIS — K4090 Unilateral inguinal hernia, without obstruction or gangrene, not specified as recurrent: Secondary | ICD-10-CM | POA: Insufficient documentation

## 2023-02-03 DIAGNOSIS — Z8673 Personal history of transient ischemic attack (TIA), and cerebral infarction without residual deficits: Secondary | ICD-10-CM

## 2023-02-03 DIAGNOSIS — N528 Other male erectile dysfunction: Secondary | ICD-10-CM

## 2023-02-03 DIAGNOSIS — N138 Other obstructive and reflux uropathy: Secondary | ICD-10-CM

## 2023-02-03 DIAGNOSIS — R399 Unspecified symptoms and signs involving the genitourinary system: Secondary | ICD-10-CM | POA: Diagnosis not present

## 2023-02-03 MED ORDER — PANTOPRAZOLE SODIUM 40 MG PO TBEC: 40.0000 mg | DELAYED_RELEASE_TABLET | Freq: Every day | ORAL | 0 refills | Status: DC

## 2023-02-03 MED ORDER — LISINOPRIL 5 MG PO TABS: 5.0000 mg | ORAL_TABLET | Freq: Every day | ORAL | 0 refills | Status: DC

## 2023-02-03 MED ORDER — EZETIMIBE 10 MG PO TABS: 10.0000 mg | ORAL_TABLET | Freq: Every day | ORAL | 0 refills | Status: DC

## 2023-02-03 MED ORDER — CLOPIDOGREL BISULFATE 75 MG PO TABS: 75.0000 mg | ORAL_TABLET | Freq: Every day | ORAL | 0 refills | Status: DC

## 2023-02-03 MED ORDER — SILDENAFIL CITRATE 100 MG PO TABS: ORAL_TABLET | ORAL | 0 refills | Status: DC

## 2023-02-04 LAB — CBC WITH DIFFERENTIAL/PLATELET
Absolute Monocytes: 577 cells/uL (ref 200–950)
Basophils Absolute: 50 cells/uL (ref 0–200)
Basophils Relative: 0.8 %
Eosinophils Absolute: 161 cells/uL (ref 15–500)
Eosinophils Relative: 2.6 %
HCT: 44.1 % (ref 38.5–50.0)
Hemoglobin: 14.5 g/dL (ref 13.2–17.1)
Lymphs Abs: 1699 cells/uL (ref 850–3900)
MCH: 28.8 pg (ref 27.0–33.0)
MCHC: 32.9 g/dL (ref 32.0–36.0)
MCV: 87.5 fL (ref 80.0–100.0)
MPV: 10 fL (ref 7.5–12.5)
Monocytes Relative: 9.3 %
Neutro Abs: 3714 cells/uL (ref 1500–7800)
Neutrophils Relative %: 59.9 %
Platelets: 251 10*3/uL (ref 140–400)
RBC: 5.04 10*6/uL (ref 4.20–5.80)
RDW: 12.9 % (ref 11.0–15.0)
Total Lymphocyte: 27.4 %
WBC: 6.2 10*3/uL (ref 3.8–10.8)

## 2023-02-04 LAB — LIPID PANEL
Cholesterol: 125 mg/dL (ref <200)
HDL: 60 mg/dL (ref >=40)
LDL Cholesterol (Calc): 50 mg/dL (calc)
Non-HDL Cholesterol (Calc): 65 mg/dL (calc) (ref <130)
Total CHOL/HDL Ratio: 2.1 (calc) (ref <5.0)
Triglycerides: 70 mg/dL (ref <150)

## 2023-02-04 LAB — COMPLETE METABOLIC PANEL WITH GFR
AG Ratio: 1.9 (calc) (ref 1.0–2.5)
ALT: 18 U/L (ref 9–46)
AST: 17 U/L (ref 10–35)
Albumin: 4.5 g/dL (ref 3.6–5.1)
Alkaline phosphatase (APISO): 70 U/L (ref 35–144)
BUN: 25 mg/dL (ref 7–25)
CO2: 24 mmol/L (ref 20–32)
Calcium: 9.2 mg/dL (ref 8.6–10.3)
Chloride: 106 mmol/L (ref 98–110)
Creat: 0.82 mg/dL (ref 0.70–1.35)
Globulin: 2.4 g/dL (calc) (ref 1.9–3.7)
Glucose, Bld: 86 mg/dL (ref 65–99)
Potassium: 4.8 mmol/L (ref 3.5–5.3)
Sodium: 139 mmol/L (ref 135–146)
Total Bilirubin: 0.6 mg/dL (ref 0.2–1.2)
Total Protein: 6.9 g/dL (ref 6.1–8.1)
eGFR: 96 mL/min/{1.73_m2} (ref >=60)

## 2023-02-04 LAB — PSA: PSA: 1.44 ng/mL (ref <=4.00)

## 2023-02-18 ENCOUNTER — Ambulatory Visit (INDEPENDENT_AMBULATORY_CARE_PROVIDER_SITE_OTHER): Payer: BC Managed Care – PPO | Admitting: Family Medicine

## 2023-02-18 ENCOUNTER — Telehealth: Payer: Self-pay

## 2023-02-18 ENCOUNTER — Encounter: Payer: Self-pay | Admitting: Family Medicine

## 2023-02-18 VITALS — BP 112/74 | HR 75 | Temp 98.0°F | Resp 14 | Ht 72.0 in | Wt 170.7 lb

## 2023-02-18 DIAGNOSIS — N529 Male erectile dysfunction, unspecified: Secondary | ICD-10-CM | POA: Diagnosis not present

## 2023-02-18 DIAGNOSIS — N401 Enlarged prostate with lower urinary tract symptoms: Secondary | ICD-10-CM

## 2023-02-18 DIAGNOSIS — I77819 Aortic ectasia, unspecified site: Secondary | ICD-10-CM

## 2023-02-18 DIAGNOSIS — K219 Gastro-esophageal reflux disease without esophagitis: Secondary | ICD-10-CM

## 2023-02-18 DIAGNOSIS — E7800 Pure hypercholesterolemia, unspecified: Secondary | ICD-10-CM

## 2023-02-18 DIAGNOSIS — Z8673 Personal history of transient ischemic attack (TIA), and cerebral infarction without residual deficits: Secondary | ICD-10-CM

## 2023-02-18 DIAGNOSIS — F1021 Alcohol dependence, in remission: Secondary | ICD-10-CM

## 2023-02-18 DIAGNOSIS — N138 Other obstructive and reflux uropathy: Secondary | ICD-10-CM

## 2023-02-18 DIAGNOSIS — D692 Other nonthrombocytopenic purpura: Secondary | ICD-10-CM

## 2023-02-18 DIAGNOSIS — I10 Essential (primary) hypertension: Secondary | ICD-10-CM

## 2023-02-18 MED ORDER — ATORVASTATIN CALCIUM 80 MG PO TABS: 80.0000 mg | ORAL_TABLET | Freq: Every day | ORAL | 5 refills | Status: DC

## 2023-02-18 MED ORDER — PANTOPRAZOLE SODIUM 20 MG PO TBEC: 20.0000 mg | DELAYED_RELEASE_TABLET | Freq: Every day | ORAL | 5 refills | Status: DC

## 2023-02-18 MED ORDER — EZETIMIBE 10 MG PO TABS: 10.0000 mg | ORAL_TABLET | Freq: Every day | ORAL | 5 refills | Status: DC

## 2023-02-18 MED ORDER — LISINOPRIL 5 MG PO TABS: 5.0000 mg | ORAL_TABLET | Freq: Every day | ORAL | 5 refills | Status: DC

## 2023-02-18 MED ORDER — CLOPIDOGREL BISULFATE 75 MG PO TABS: 75.0000 mg | ORAL_TABLET | Freq: Every day | ORAL | 5 refills | Status: DC

## 2023-02-18 NOTE — Progress Notes (Signed)
Name: Raymond Chambers   MRN: [id number]    DOB: 1956/01/28   Date:02/18/2023       Progress Note  Subjective  Chief Complaint  Chief Complaint  Patient presents with   Follow-up    HPI   HTN: he is on low dose lisinopril now and bp is towards low end of normal, he will continue current dose and cut down to half pill the week prior to his next visit and if still controlled we will stay on lower dose    TIA: he was at work on 04/2015 and developed acute onset of dizziness. Followed by left facial tingling, left leg and arm tingling and heaviness, the episode lasted about 2 minutes . He went to [facility] and transferred to [facility], he had multiple tests done, abnormal findings ( stable mild apical pleural thickening and bicuspid Aortic Valve, increase in in subcortical hyper intensities slightly greater than expected for age) and elevated LDL.  He has been taking Atorvastatin and Zetia, LDL is at goal down to 50 . Continue current regiment    History of alcoholism: used to be a heavy drinker, currently only drinking one glass of wine a couple of times a month  Stable    ED: he states viagra is no longer working , referral placed to urologist    GERD: he was having worsening of symptoms in January , we discussed cutting down on coffee, he was drinking 32 oz and is now drinking 24 oz per day, he is also not eating before bed time, cutting down on spicy food.  He  is doing well on Pantoprazole now and we will try going down on the dose to 20 mg    Aortic Root dilation: found by cardiologist 4.3 cm on Echocardiogram, Dr. Ervin Knack oredered a CT angio that was done June 2023 but is due for repeat study , we will give him his number    MPRESSION:01/2022  Grossly stable 4.4 cm ascending thoracic aortic aneurysm. Recommend annual imaging followup by CTA or MRA   Patient Active Problem List   Diagnosis Date Noted   Left inguinal hernia 02/03/2023   Diverticulosis 09/06/2022   Senile purpura  (HCC) 09/06/2022   Grade I internal hemorrhoids 09/06/2022   Aortic root dilation (HCC) 05/01/2021   BPH associated with nocturia 03/24/2018   Rectal polyp    Mild aortic regurgitation 11/26/2016   History of alcoholism (HCC) 04/17/2015   ED (erectile dysfunction) of non-organic origin 04/17/2015   Acid reflux 04/17/2015   Herniation of nucleus pulposus 04/17/2015   Bilateral inguinal hernia 04/17/2015   H/O malignant neoplasm of skin 04/17/2015   Bicuspid aortic valve 04/17/2015   HLD (hyperlipidemia)    Hx of transient ischemic attack (TIA) 04/16/2015    Past Surgical History:  Procedure Laterality Date   COLONOSCOPY WITH PROPOFOL N/A 03/29/2017   Procedure: COLONOSCOPY WITH PROPOFOL;  Surgeon: Midge Minium, MD;  Location: [facility] ENDOSCOPY;  Service: Endoscopy;  Laterality: N/A;   COLONOSCOPY WITH PROPOFOL N/A 07/27/2022   Procedure: COLONOSCOPY WITH PROPOFOL;  Surgeon: Midge Minium, MD;  Location: [facility] ENDOSCOPY;  Service: Endoscopy;  Laterality: N/A;   KNEE SURGERY Left 2005    Family History  Problem Relation Age of Onset   Cancer Father        Stomach    Heart disease Father    Diabetes Maternal Grandfather    Alcohol abuse Paternal Uncle    Obesity Brother     Social History  Tobacco Use   Smoking status: Former    Types: Pipe    Start date: 08/10/2003    Quit date: 08/09/2013    Years since quitting: 9.5   Smokeless tobacco: Never  Substance Use Topics   Alcohol use: Yes    Alcohol/week: 1.0 standard drink of alcohol    Types: 1 Glasses of wine per week    Comment: wine with dinner very occasionally     Current Outpatient Medications:    atorvastatin (LIPITOR) 80 MG tablet, TAKE ONE TABLET BY MOUTH ONE TIME DAILY, Disp: 30 tablet, Rfl: 3   clopidogrel (PLAVIX) 75 MG tablet, Take 1 tablet (75 mg total) by mouth daily., Disp: 30 tablet, Rfl: 0   diphenhydrAMINE (BENADRYL) 12.5 MG/5ML elixir, Take by mouth., Disp: , Rfl:    ezetimibe (ZETIA) 10 MG tablet, Take  1 tablet (10 mg total) by mouth daily., Disp: 30 tablet, Rfl: 0   fluticasone (FLONASE) 50 MCG/ACT nasal spray, USE TWO SPRAYS IN THE AFFECTED NOSTRIL ONE TIME DAILY, Disp: 16 mL, Rfl: 0   lisinopril (ZESTRIL) 5 MG tablet, Take 1 tablet (5 mg total) by mouth daily., Disp: 30 tablet, Rfl: 0   pantoprazole (PROTONIX) 40 MG tablet, Take 1 tablet (40 mg total) by mouth daily before breakfast., Disp: 30 tablet, Rfl: 0   sildenafil (VIAGRA) 100 MG tablet, TAKE ONE TABLET BY MOUTH DAILY AS NEEDED FOR ERECTILE DYSFUNCTION, Disp: 30 tablet, Rfl: 0  Allergies  Allergen Reactions   Penicillins Hives and Rash    I personally reviewed active problem list, medication list, allergies with the patient/caregiver today.   ROS  Ten systems reviewed and is negative except as mentioned in HPI   Objective  Vitals:   02/18/23 1602  BP: 112/74  Pulse: 75  Resp: 14  Temp: 98 F (36.7 C)  TempSrc: Oral  SpO2: 96%  Weight: 170 lb 11.2 oz (77.4 kg)  Height: 6' (1.829 m)    Body mass index is 23.15 kg/m.  Physical Exam  Constitutional: Patient appears well-developed and well-nourished.  No distress.  HEENT: head atraumatic, normocephalic, pupils equal and reactive to light, neck supple Cardiovascular: Normal rate, regular rhythm and normal heart sounds.  No murmur heard. No BLE edema. Pulmonary/Chest: Effort normal and breath sounds normal. No respiratory distress. Abdominal: Soft.  There is no tenderness. Psychiatric: Patient has a normal mood and affect. behavior is normal. Judgment and thought content normal.   Recent Results (from the past 2160 hour(s))  Lipid panel     Status: None   Collection Time: 02/03/23  8:36 AM  Result Value Ref Range   Cholesterol 125 <200 mg/dL   HDL 60 > OR = 40 mg/dL   Triglycerides 70 <295 mg/dL   LDL Cholesterol (Calc) 50 mg/dL (calc)    Comment: Reference range: <100 . Desirable range <100 mg/dL for primary prevention;   <70 mg/dL for patients with CHD  or diabetic patients  with > or = 2 CHD risk factors. Marland Kitchen LDL-C is now calculated using the Martin-Hopkins  calculation, which is a validated novel method providing  better accuracy than the Friedewald equation in the  estimation of LDL-C.  Horald Pollen et al. Lenox Ahr. 6213;086(57): 2061-2068  (http://education.[employer].com/faq/FAQ164)    Total CHOL/HDL Ratio 2.1 <5.0 (calc)   Non-HDL Cholesterol (Calc) 65 <846 mg/dL (calc)    Comment: For patients with diabetes plus 1 major ASCVD risk  factor, treating to a non-HDL-C goal of <100 mg/dL  (LDL-C of <96 mg/dL) is  considered a therapeutic  option.   CBC with Differential/Platelet     Status: None   Collection Time: 02/03/23  8:36 AM  Result Value Ref Range   WBC 6.2 3.8 - 10.8 Thousand/uL   RBC 5.04 4.20 - 5.80 Million/uL   Hemoglobin 14.5 13.2 - 17.1 g/dL   HCT 16.1 09.6 - 04.5 %   MCV 87.5 80.0 - 100.0 fL   MCH 28.8 27.0 - 33.0 pg   MCHC 32.9 32.0 - 36.0 g/dL   RDW 40.9 81.1 - 91.4 %   Platelets 251 140 - 400 Thousand/uL   MPV 10.0 7.5 - 12.5 fL   Neutro Abs 3,714 1,500 - 7,800 cells/uL   Lymphs Abs 1,699 850 - 3,900 cells/uL   Absolute Monocytes 577 200 - 950 cells/uL   Eosinophils Absolute 161 15 - 500 cells/uL   Basophils Absolute 50 0 - 200 cells/uL   Neutrophils Relative % 59.9 %   Total Lymphocyte 27.4 %   Monocytes Relative 9.3 %   Eosinophils Relative 2.6 %   Basophils Relative 0.8 %  COMPLETE METABOLIC PANEL WITH GFR     Status: None   Collection Time: 02/03/23  8:36 AM  Result Value Ref Range   Glucose, Bld 86 65 - 99 mg/dL    Comment: .            Fasting reference interval .    BUN 25 7 - 25 mg/dL   Creat 7.82 9.56 - 2.13 mg/dL   eGFR 96 > OR = 60 YQ/MVH/8.46N6   BUN/Creatinine Ratio SEE NOTE: 6 - 22 (calc)    Comment:    Not Reported: BUN and Creatinine are within    reference range. .    Sodium 139 135 - 146 mmol/L   Potassium 4.8 3.5 - 5.3 mmol/L   Chloride 106 98 - 110 mmol/L   CO2 24 20 - 32  mmol/L   Calcium 9.2 8.6 - 10.3 mg/dL   Total Protein 6.9 6.1 - 8.1 g/dL   Albumin 4.5 3.6 - 5.1 g/dL   Globulin 2.4 1.9 - 3.7 g/dL (calc)   AG Ratio 1.9 1.0 - 2.5 (calc)   Total Bilirubin 0.6 0.2 - 1.2 mg/dL   Alkaline phosphatase (APISO) 70 35 - 144 U/L   AST 17 10 - 35 U/L   ALT 18 9 - 46 U/L  PSA     Status: None   Collection Time: 02/03/23  8:36 AM  Result Value Ref Range   PSA 1.44 < OR = 4.00 ng/mL    Comment: The total PSA value from this assay system is  standardized against the WHO standard. The test  result will be approximately 20% lower when compared  to the equimolar-standardized total PSA (Beckman  Coulter). Comparison of serial PSA results should be  interpreted with this fact in mind. . This test was performed using the Siemens  chemiluminescent method. Values obtained from  different assay methods cannot be used interchangeably. PSA levels, regardless of value, should not be interpreted as absolute evidence of the presence or absence of disease.      PHQ2/9:    02/03/2023    7:45 AM 09/06/2022    1:51 PM 07/27/2021    3:49 PM 05/01/2021    2:49 PM 12/14/2019   10:48 AM  Depression screen PHQ 2/9  Decreased Interest 0 0 0 0 0  Down, Depressed, Hopeless 1 0 0 0 0  PHQ - 2 Score 1 0 0 0  0  Altered sleeping 0 0 0 0 0  Tired, decreased energy 0 0 0 0 0  Change in appetite 0 0 0 0 0  Feeling bad or failure about yourself  0 0 0 0 0  Trouble concentrating 0 0 0 0 0  Moving slowly or fidgety/restless 0 0 0 0 0  Suicidal thoughts 0 0 0 0 0  PHQ-9 Score 1 0 0 0 0  Difficult doing work/chores   Not difficult at all      phq 9 is negative  Fall Risk:    02/18/2023    4:01 PM 02/03/2023    7:45 AM 09/06/2022    1:51 PM 07/27/2021    3:49 PM 05/01/2021    2:39 PM  Fall Risk   Falls in the past year? 0 0 0 0 0  Number falls in past yr:  0 0 0   Injury with Fall?  0 0 0   Risk for fall due to : No Fall Risks No Fall Risks No Fall Risks    Follow up Falls  prevention discussed Falls prevention discussed Falls prevention discussed  Falls prevention discussed      Functional Status Survey: Is the patient deaf or have difficulty hearing?: No Does the patient have difficulty seeing, even when wearing glasses/contacts?: No Does the patient have difficulty concentrating, remembering, or making decisions?: No Does the patient have difficulty walking or climbing stairs?: No Does the patient have difficulty dressing or bathing?: No Does the patient have difficulty doing errands alone such as visiting a doctor's office or shopping?: No    Assessment & Plan  1. Aortic dilatation [facility])  He needs to go back to Dr. Rennis Golden   2. Senile purpura (HCC)  Stable and reassurance given   3. History of alcoholism (HCC)  Very seldom has wine now   4. ED (erectile dysfunction) of organic origin  No longer responding to Viagra , referral placed to urologist and he needs the number so he can call them back   5. BPH with obstruction/lower urinary tract symptoms  PSA normal range  6. Pure hypercholesterolemia  - atorvastatin (LIPITOR) 80 MG tablet; Take 1 tablet (80 mg total) by mouth daily.  Dispense: 30 tablet; Refill: 5  9. Hypertension, benign  - lisinopril (ZESTRIL) 5 MG tablet; Take 1 tablet (5 mg total) by mouth daily.  Dispense: 30 tablet; Refill: 5  10. Hx of transient ischemic attack (TIA)  - ezetimibe (ZETIA) 10 MG tablet; Take 1 tablet (10 mg total) by mouth daily.  Dispense: 30 tablet; Refill: 5 - atorvastatin (LIPITOR) 80 MG tablet; Take 1 tablet (80 mg total) by mouth daily.  Dispense: 30 tablet; Refill: 5 - clopidogrel (PLAVIX) 75 MG tablet; Take 1 tablet (75 mg total) by mouth daily.  Dispense: 30 tablet; Refill: 5  10. GERD without esophagitis  - pantoprazole (PROTONIX) 20 MG tablet; Take 1 tablet (20 mg total) by mouth daily before breakfast.  Dispense: 30 tablet; Refill: 5

## 2023-02-18 NOTE — Telephone Encounter (Signed)
Pt's wife called for lab results. Wife not on DPR. Unable to disclose information.

## 2023-02-18 NOTE — Patient Instructions (Addendum)
Try to cut down on lisinopril to half pill the week before your next follow up.  [facility]: [phone]  Call Dr Rennis Golden for repeat testing.

## 2023-02-25 ENCOUNTER — Telehealth: Payer: BC Managed Care – PPO | Admitting: Family Medicine

## 2023-03-17 ENCOUNTER — Ambulatory Visit: Payer: BC Managed Care – PPO | Admitting: Urology

## 2023-03-17 ENCOUNTER — Encounter: Payer: Self-pay | Admitting: Urology

## 2023-03-17 VITALS — BP 121/83 | HR 91 | Ht 72.0 in | Wt 165.0 lb

## 2023-03-17 DIAGNOSIS — N529 Male erectile dysfunction, unspecified: Secondary | ICD-10-CM

## 2023-03-17 DIAGNOSIS — N401 Enlarged prostate with lower urinary tract symptoms: Secondary | ICD-10-CM | POA: Diagnosis not present

## 2023-03-17 LAB — URINALYSIS, COMPLETE
Bilirubin, UA: NEGATIVE
Glucose, UA: NEGATIVE
Ketones, UA: NEGATIVE
Leukocytes,UA: NEGATIVE
Nitrite, UA: NEGATIVE
Protein,UA: NEGATIVE
RBC, UA: NEGATIVE
Specific Gravity, UA: 1.02 (ref 1.005–1.030)
Urobilinogen, Ur: 0.2 mg/dL (ref 0.2–1.0)
pH, UA: 5.5 (ref 5.0–7.5)

## 2023-03-17 LAB — MICROSCOPIC EXAMINATION

## 2023-03-17 LAB — BLADDER SCAN AMB NON-IMAGING: Scan Result: 0

## 2023-03-17 MED ORDER — AMBULATORY NON FORMULARY MEDICATION: 0 refills | Status: AC

## 2023-03-17 NOTE — Patient Instructions (Signed)
[email or web address]

## 2023-03-17 NOTE — Progress Notes (Signed)
I,Raymond Chambers,acting as a scribe for Raymond Altes, MD.,have documented all relevant documentation on the behalf of Raymond Altes, MD,as directed by  Raymond Altes, MD while in the presence of Raymond Altes, MD.  03/17/2023 4:41 PM   Raymond Chambers [birth year] [id number]  Referring provider: Alba Cory, MD [address] [place],  [place] [address]  Chief Complaint  Patient presents with   Benign Prostatic Hypertrophy    HPI: Raymond Chambers is a 67 y.o. male referred for evaluation of erectile dysfunction.  5 year history of erectile dysfunction with difficulty achieving and maintaining an erection; symptoms have progressively worsened. Has been on Sildenafil 100 mg for approximately one year, which was initially effective, but has subsequently lost its efficacy. Denies pain or curvature with erections. He was also referred for BPH, however, states he has no bothersome lower urinary tract symptoms. He has nocturia x1-2, but not bothersome enough that he desires further evaluation or treatment. IPSS today was 2/35. ED organic risk factors include HTN, antihypertensive medications, hypercholesterolemia, and cerebrovascular disease. Former pipe smoker for approximately 10 years and quit in 2015.    PMH: Past Medical History:  Diagnosis Date   Abnormal CXR (chest x-ray)    Repeat CXR in 6 months   Alcohol abuse, in remission    Bilateral inguinal hernia    Cyst of finger    Depression    GERD (gastroesophageal reflux disease)    Herniated lumbar intervertebral disc    L-5   History of attention deficit disorder    History of skin cancer    Hyperlipidemia    Stroke [facility])    TIA     Surgical History: Past Surgical History:  Procedure Laterality Date   COLONOSCOPY WITH PROPOFOL N/A 03/29/2017   Procedure: COLONOSCOPY WITH PROPOFOL;  Surgeon: Midge Minium, MD;  Location: [facility] ENDOSCOPY;  Service: Endoscopy;  Laterality: N/A;   COLONOSCOPY WITH  PROPOFOL N/A 07/27/2022   Procedure: COLONOSCOPY WITH PROPOFOL;  Surgeon: Midge Minium, MD;  Location: [facility] ENDOSCOPY;  Service: Endoscopy;  Laterality: N/A;   KNEE SURGERY Left 2005    Home Medications:  Allergies as of 03/17/2023       Reactions   Penicillins Hives, Rash        Medication List        Accurate as of March 17, 2023  4:41 PM. If you have any questions, ask your nurse or doctor.          AMBULATORY NON FORMULARY MEDICATION Trimix (30/1/10)-(Pap/Phent/PGE)  Test Dose  1ml vial   Qty #3 Refills 0  [facility] [phone] Fax [phone] Started by: Raymond Chambers   atorvastatin 80 MG tablet Commonly known as: LIPITOR Take 1 tablet (80 mg total) by mouth daily.   clopidogrel 75 MG tablet Commonly known as: PLAVIX Take 1 tablet (75 mg total) by mouth daily.   diphenhydrAMINE 12.5 MG/5ML elixir Commonly known as: BENADRYL Take by mouth.   ezetimibe 10 MG tablet Commonly known as: ZETIA Take 1 tablet (10 mg total) by mouth daily.   fluticasone 50 MCG/ACT nasal spray Commonly known as: FLONASE USE TWO SPRAYS IN THE AFFECTED NOSTRIL ONE TIME DAILY   lisinopril 5 MG tablet Commonly known as: ZESTRIL Take 1 tablet (5 mg total) by mouth daily.   pantoprazole 20 MG tablet Commonly known as: PROTONIX Take 1 tablet (20 mg total) by mouth daily before breakfast.   sildenafil 100 MG tablet Commonly known  as: VIAGRA TAKE ONE TABLET BY MOUTH DAILY AS NEEDED FOR ERECTILE DYSFUNCTION        Allergies:  Allergies  Allergen Reactions   Penicillins Hives and Rash    Family History: Family History  Problem Relation Age of Onset   Cancer Father        Stomach    Heart disease Father    Diabetes Maternal Grandfather    Alcohol abuse Paternal Uncle    Obesity Brother     Social History:  reports that he quit smoking about 9 years ago. His smoking use included pipe. He started smoking about 19 years ago. He has never used smokeless  tobacco. He reports current alcohol use of about 1.0 standard drink of alcohol per week. He reports that he does not use drugs.   Physical Exam: BP 121/83   Pulse 91   Ht 6' (1.829 m)   Wt 165 lb (74.8 kg)   BMI 22.38 kg/m   Constitutional:  Alert and oriented, No acute distress. HEENT: [place] AT Respiratory: Normal respiratory effort, no increased work of breathing. GU: Phallus without lesions. No corporal plaques. Testes descended bilaterally without masses or tenderness. Prostate 50 g, smooth without nodules. Psychiatric: Normal mood and affect.   Assessment & Plan:    1. Erectile dysfunction Positive organic risk factors. PDE5 inhibitor refractory. We discussed second line options, including vaccuum erection devices and intracavernosal injections. He was interested in pursuing intracavernosal injections. We discussed potential side effects, including corporal scarring and priapism. A test dose Trimix Rx sent to [facility].  He will be scheduled for a PA visit for injection training.   I have reviewed the above documentation for accuracy and completeness, and I agree with the above.   Raymond Altes, MD  [facility] [address] [place], [place] [address] [phone]

## 2023-04-21 ENCOUNTER — Telehealth (INDEPENDENT_AMBULATORY_CARE_PROVIDER_SITE_OTHER): Payer: Self-pay

## 2023-04-21 DIAGNOSIS — J069 Acute upper respiratory infection, unspecified: Secondary | ICD-10-CM | POA: Diagnosis not present

## 2023-04-21 NOTE — Telephone Encounter (Signed)
Spoke with pt and we had a tentative date for the AAA, however I needed to get him in earlier due to fasting - the phone cut off. I called back and left him a message stating that I would not be making the tentative appt and would wait for him to call back.   [facility] AAA

## 2023-05-12 ENCOUNTER — Ambulatory Visit (HOSPITAL_COMMUNITY)
Admission: RE | Admit: 2023-05-12 | Discharge: 2023-05-12 | Disposition: A | Discharge status: Home / Self Care | Payer: BC Managed Care – PPO | Source: Ambulatory Visit | Attending: Internal Medicine | Admitting: Internal Medicine

## 2023-05-12 DIAGNOSIS — Z87891 Personal history of nicotine dependence: Secondary | ICD-10-CM | POA: Insufficient documentation

## 2023-05-12 DIAGNOSIS — Z8673 Personal history of transient ischemic attack (TIA), and cerebral infarction without residual deficits: Secondary | ICD-10-CM | POA: Insufficient documentation

## 2023-05-28 IMAGING — CT CT ANGIO CHEST
2 of 8 series · 16 of 46 positions shown · IV contrast (OMNIPAQUE 350)
Comparison: None.

CLINICAL DATA: Concern for aortic dilatation on cardiac echo.

EXAM:
CT ANGIOGRAPHY CHEST WITH CONTRAST
TECHNIQUE: Multidetector CT imaging of the chest was performed using the
standard protocol during bolus administration of intravenous
contrast. Multiplanar CT image reconstructions and MIPs were
obtained to evaluate the vascular anatomy.
CONTRAST:  80mL OMNIPAQUE IOHEXOL 350 MG/ML SOLN

[Series 4: aorta 3.0 bf37 2 · axial · 0.66mm/px · z∈[-312,-40]mm · 13 of 107 slices shown]
[im 8/107  lung]
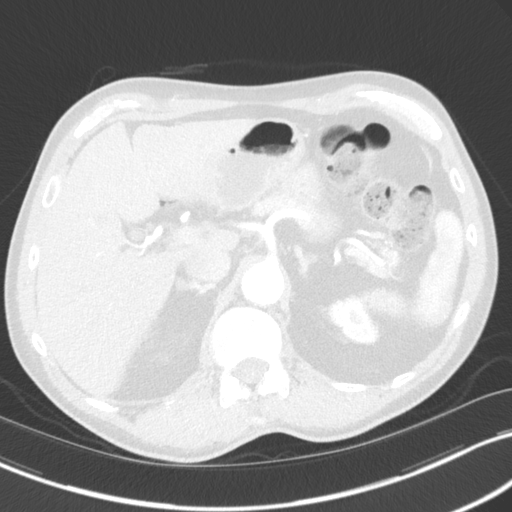
[im 16/107  soft-tissue]
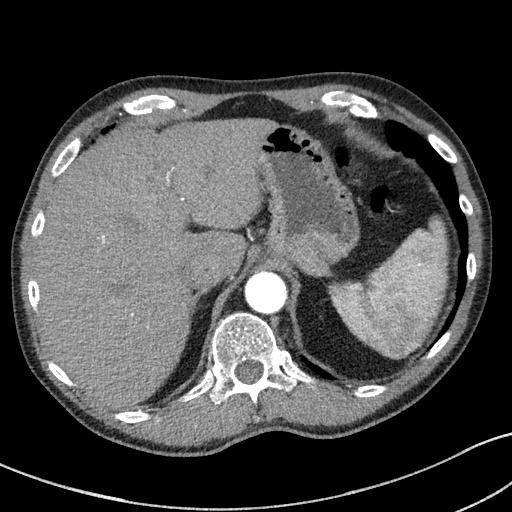
[im 23/107  lung]
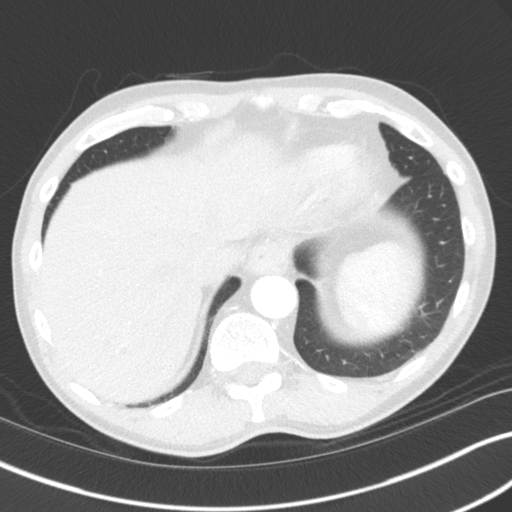
[im 31/107  soft-tissue]
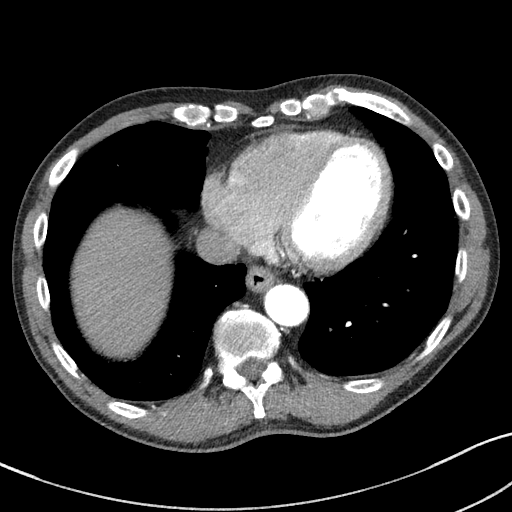
[im 38/107  lung]
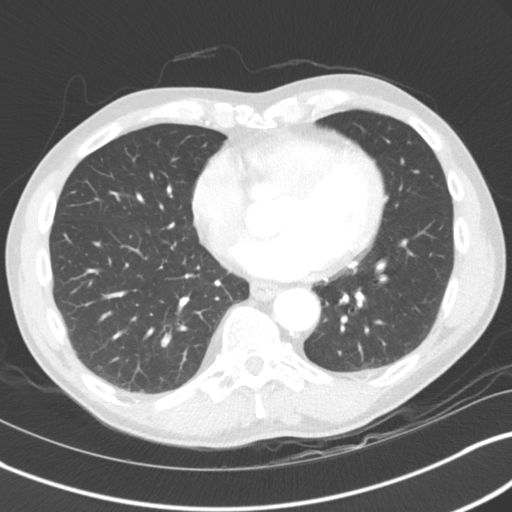
[im 46/107  soft-tissue]
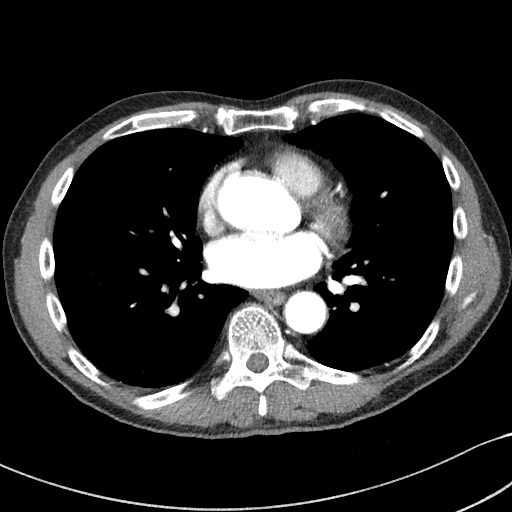
[im 54/107  lung]
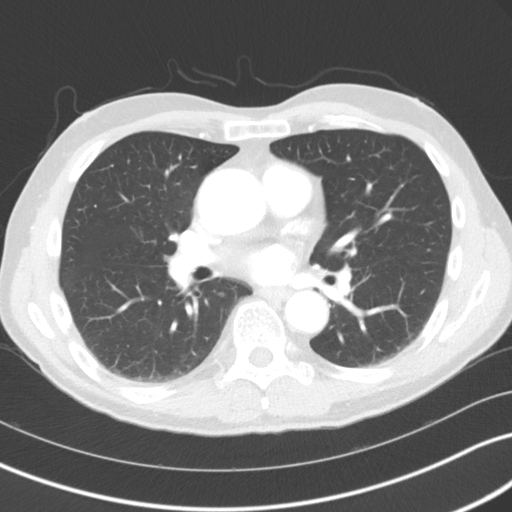
[im 61/107  soft-tissue]
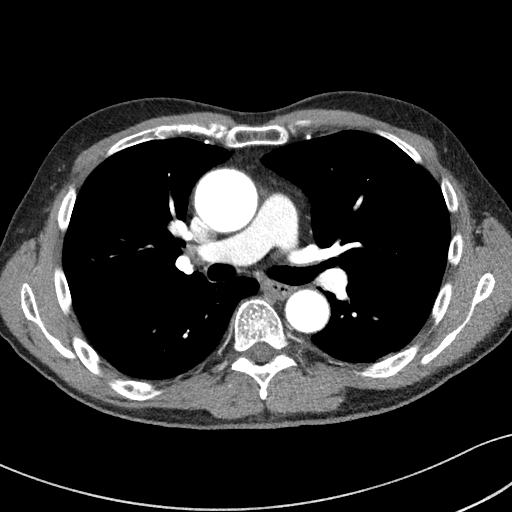
[im 69/107  lung]
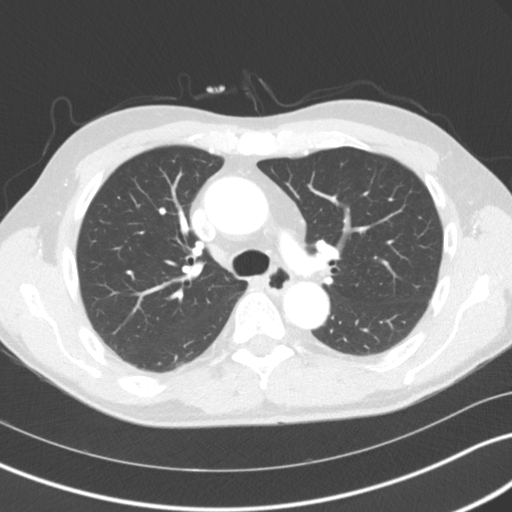
[im 76/107  soft-tissue]
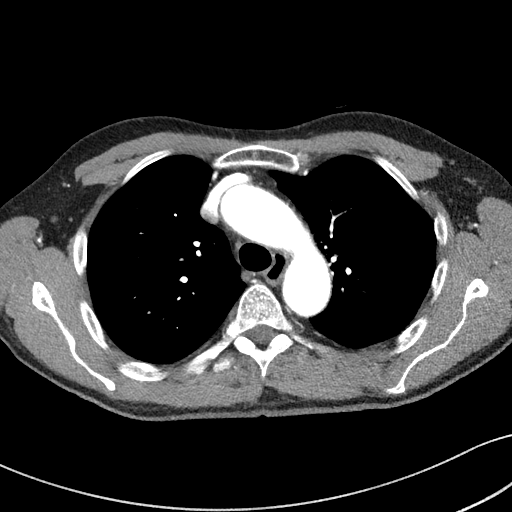
[im 84/107  lung]
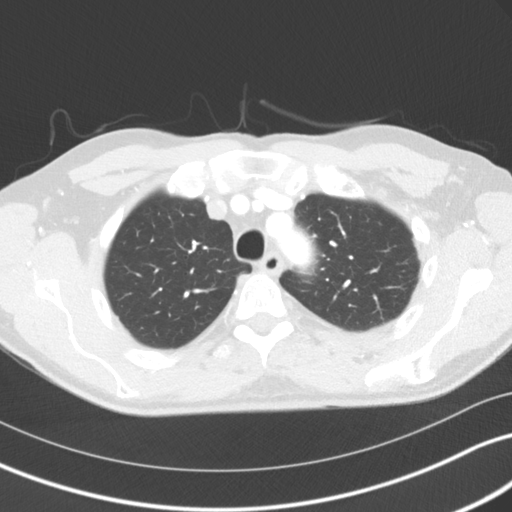
[im 91/107  soft-tissue]
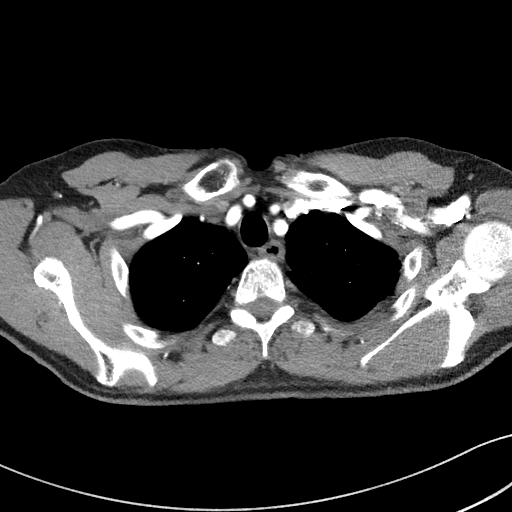
[im 99/107  lung]
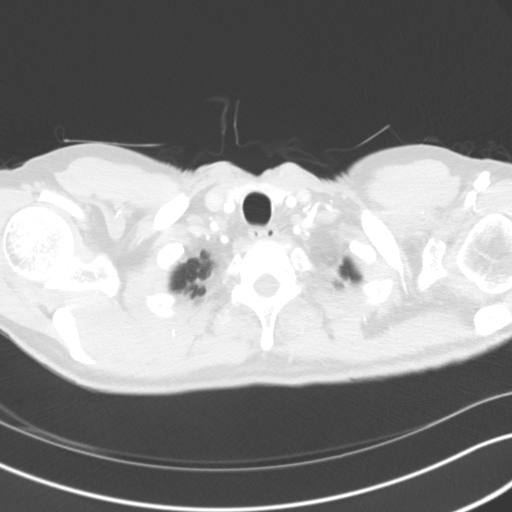

[Series 7: coronals · coronal · 0.63mm/px · 3 of 115 slices shown]
[im 29/115  soft-tissue]
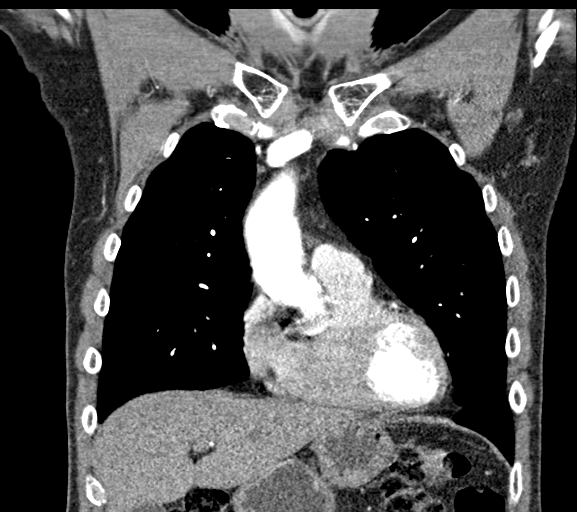
[im 58/115  soft-tissue]
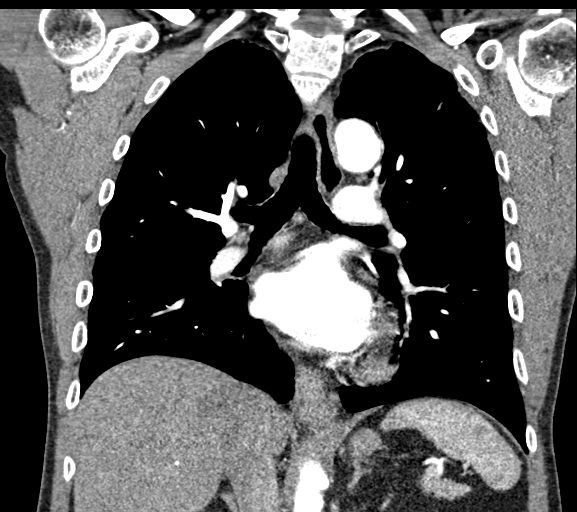
[im 86/115  soft-tissue]
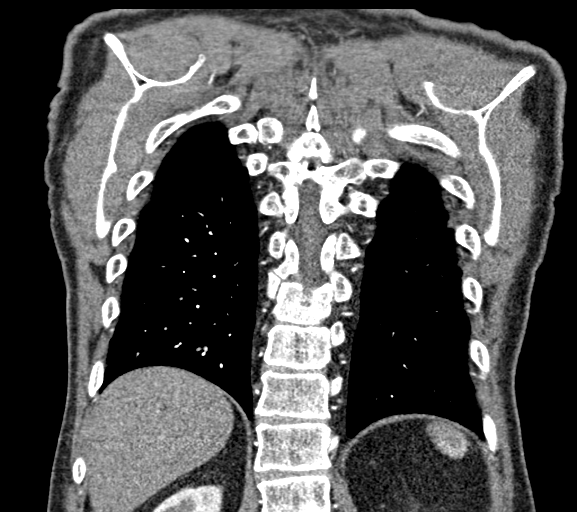

[16 of 46 positions shown; findings below may reference images not displayed]

FINDINGS: Vascular Findings:

Fusiform aneurysmal dilatation of the ascending thoracic aorta with
measurements as follows. The thoracic aorta tapers to a normal
caliber at the level of the aortic arch. Note is made of a small
ductus diverticulum. The descending thoracic aorta is of normal
caliber and widely patent without hemodynamically significant
narrowing. No evidence of thoracic aortic dissection or periaortic
stranding on this non gated examination.

Borderline cardiomegaly. No pericardial effusion. Suspected coronary
artery calcifications.

Although this examination was not tailored for the evaluation the
pulmonary arteries, there are no discrete filling defects within the
central pulmonary arterial tree to suggest central pulmonary
embolism. Normal caliber of the main pulmonary artery.

-------------------------------------------------------------

Thoracic aortic measurements:

SINOTUBULAR JUNCTION: 37 mm as measured in greatest oblique short
axis coronal dimension.

PROXIMAL ASCENDING THORACIC AORTA: 43 mm as measured in greatest
oblique short axis axial dimension at the level of the main
pulmonary artery (axial image 47, series 4) and approximately 45 mm
in greatest oblique short axis coronal diameter (coronal image 37,
series 7)

AORTIC ARCH: 31 mm as measured in greatest oblique short axis
sagittal dimension.

PROXIMAL DESCENDING THORACIC AORTA: 29 mm as measured in greatest
oblique short axis axial dimension at the level of the main
pulmonary artery.

DISTAL DESCENDING THORACIC AORTA: 20 mm as measured in greatest
oblique short axis axial dimension at the level of the diaphragmatic
hiatus.

Review of the MIP images confirms the above findings.

-------------------------------------------------------------

Non-Vascular Findings:

Mediastinum/Lymph Nodes: No bulky mediastinal, hilar or axillary
lymphadenopathy.

Lungs/Pleura: Minimal dependent subpleural ground-glass atelectasis.
Minimal grossly symmetric biapical pleuroparenchymal thickening. No
discrete focal airspace opacities. No pleural effusion or
pneumothorax. The central pulmonary airways appear widely patent.

There are 2 punctate (sub 4 mm) subpleural nodules within the left
lower lobe (images 77 and 79, series 5). Note is made of a
perifissural lymph node within the left fissure (image 51, series
5).

Upper abdomen: Limited early arterial phase evaluation of the upper
abdomen demonstrates a punctate (2 mm) radiopaque gallstone within
otherwise normal-appearing gallbladder (image 104, series 4). Small
hiatal hernia.

Musculoskeletal: No acute or aggressive osseous abnormalities.
Regional soft tissues appear normal. Normal appearance of the
thyroid gland.
IMPRESSION: 1. Uncomplicated fusiform aneurysmal dilatation of the ascending
thoracic aorta measuring 45 mm in diameter. Recommend semi-annual
imaging followup by CTA or MRA and referral to cardiothoracic
surgery if not already obtained. This recommendation follows 3767
ACCF/AHA/AATS/ACR/ASA/SCA/UYEN/ZLATANA/SHAQUITA/DHAIMA Guidelines for the
Diagnosis and Management of Patients With Thoracic Aortic Disease.
Circulation. 3767; 121: E266-e369. Aortic aneurysm NOS (6D5BO-P24.4)
2. Borderline cardiomegaly.
3. Suspected coronary artery calcifications. Aortic Atherosclerosis
(6D5BO-XVX.X).

## 2023-07-28 ENCOUNTER — Other Ambulatory Visit: Payer: Self-pay | Admitting: Family Medicine

## 2023-07-28 DIAGNOSIS — N528 Other male erectile dysfunction: Secondary | ICD-10-CM

## 2023-08-01 ENCOUNTER — Ambulatory Visit: Payer: BC Managed Care – PPO | Admitting: Family Medicine

## 2023-08-25 ENCOUNTER — Ambulatory Visit: Payer: BC Managed Care – PPO | Admitting: Family Medicine

## 2023-08-25 ENCOUNTER — Encounter: Payer: Self-pay | Admitting: Family Medicine

## 2023-08-25 VITALS — BP 118/76 | HR 73 | Temp 97.9°F | Resp 16 | Ht 72.0 in | Wt 173.0 lb

## 2023-08-25 DIAGNOSIS — H2513 Age-related nuclear cataract, bilateral: Secondary | ICD-10-CM | POA: Diagnosis not present

## 2023-08-25 DIAGNOSIS — H43813 Vitreous degeneration, bilateral: Secondary | ICD-10-CM | POA: Diagnosis not present

## 2023-08-25 DIAGNOSIS — N529 Male erectile dysfunction, unspecified: Secondary | ICD-10-CM

## 2023-08-25 DIAGNOSIS — N138 Other obstructive and reflux uropathy: Secondary | ICD-10-CM

## 2023-08-25 DIAGNOSIS — E7800 Pure hypercholesterolemia, unspecified: Secondary | ICD-10-CM

## 2023-08-25 DIAGNOSIS — H35032 Hypertensive retinopathy, left eye: Secondary | ICD-10-CM | POA: Diagnosis not present

## 2023-08-25 DIAGNOSIS — I77819 Aortic ectasia, unspecified site: Secondary | ICD-10-CM

## 2023-08-25 DIAGNOSIS — D692 Other nonthrombocytopenic purpura: Secondary | ICD-10-CM | POA: Diagnosis not present

## 2023-08-25 DIAGNOSIS — I10 Essential (primary) hypertension: Secondary | ICD-10-CM

## 2023-08-25 DIAGNOSIS — N401 Enlarged prostate with lower urinary tract symptoms: Secondary | ICD-10-CM

## 2023-08-25 DIAGNOSIS — F1021 Alcohol dependence, in remission: Secondary | ICD-10-CM

## 2023-08-25 DIAGNOSIS — Z8673 Personal history of transient ischemic attack (TIA), and cerebral infarction without residual deficits: Secondary | ICD-10-CM

## 2023-08-25 DIAGNOSIS — K219 Gastro-esophageal reflux disease without esophagitis: Secondary | ICD-10-CM

## 2023-08-25 DIAGNOSIS — H35372 Puckering of macula, left eye: Secondary | ICD-10-CM | POA: Diagnosis not present

## 2023-08-25 MED ORDER — EZETIMIBE 10 MG PO TABS: 10.0000 mg | ORAL_TABLET | Freq: Every day | ORAL | 5 refills | Status: DC

## 2023-08-25 MED ORDER — FLUTICASONE PROPIONATE 50 MCG/ACT NA SUSP: 2.0000 | Freq: Every day | NASAL | 5 refills | Status: AC

## 2023-08-25 MED ORDER — CLOPIDOGREL BISULFATE 75 MG PO TABS: 75.0000 mg | ORAL_TABLET | Freq: Every day | ORAL | 5 refills | Status: DC

## 2023-08-25 MED ORDER — PANTOPRAZOLE SODIUM 20 MG PO TBEC: 20.0000 mg | DELAYED_RELEASE_TABLET | Freq: Every day | ORAL | 5 refills | Status: DC

## 2023-08-25 MED ORDER — ATORVASTATIN CALCIUM 80 MG PO TABS: 80.0000 mg | ORAL_TABLET | Freq: Every day | ORAL | 5 refills | Status: DC

## 2023-08-25 MED ORDER — LISINOPRIL 5 MG PO TABS: 5.0000 mg | ORAL_TABLET | Freq: Every day | ORAL | 5 refills | Status: DC

## 2023-08-25 NOTE — Progress Notes (Signed)
Name: Raymond Chambers   MRN: [id number]    DOB: 06-Jun-1956   Date:08/25/2023       Progress Note  Subjective  Chief Complaint  Chief Complaint  Patient presents with   Medical Management of Chronic Issues    Pt unsure if he is taking zetia   HPI   HTN: he is on low dose lisinopril and bp is towards low end of normal , denies orthostatic changes. No chest pain . He had one episodes of palpitation for several minutes about one month ago. He drinks a lot of caffeine but is not sure if he drank anymore than usual. He denies associated with dizziness, diaphoresis or chest pain or SOB. Discussed Zio monitor if it happens again   TIA: he was at work on 04/2015 and developed acute onset of dizziness. Followed by left facial tingling, left leg and arm tingling and heaviness, the episode lasted about 2 minutes . He went to [facility] and transferred to [facility], he had multiple tests done, abnormal findings ( stable mild apical pleural thickening and bicuspid Aortic Valve, increase in in subcortical hyper intensities slightly greater than expected for age) and elevated LDL. He has been taking Atorvastatin and Zetia, LDL is at goal down to 50 . However today he did not bring a bottle of Zetia with his other medications, he is not sure if currently taking it, but willing to resume.    History of alcoholism: used to be a heavy drinker, currently only drinking one glass of wine a couple of times a month  Unchanged   ED: he states viagra is no longer working , he saw the Urologist but Trimix is too costly and decided to hold off. Advised to ask about other options    GERD: he was having worsening of symptoms in January 2024 , we discussed cutting down on coffee, he was drinking 32 oz and is now drinking 24 oz per day, he is also not eating before bed time, cutting down on spicy food. 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 Ervin Knack oredered a CT angio that was done June 2023 and stable size at 4.4 cm . Radiologist recommended annual imaging that is monitored by cardiologist     Patient Active Problem List   Diagnosis Date Noted   Aortic dilatation (HCC) 02/18/2023   Left inguinal hernia 02/03/2023   Diverticulosis 09/06/2022   Senile purpura (HCC) 09/06/2022   Grade I internal hemorrhoids 09/06/2022   Aortic root dilation (HCC) 05/01/2021   BPH associated with nocturia 03/24/2018   Rectal polyp    Mild aortic regurgitation 11/26/2016   History of alcoholism (HCC) 04/17/2015   ED (erectile dysfunction) of non-organic origin 04/17/2015   Acid reflux 04/17/2015   Herniation of nucleus pulposus 04/17/2015   Bilateral inguinal hernia 04/17/2015   H/O malignant neoplasm of skin 04/17/2015   Bicuspid aortic valve 04/17/2015   HLD (hyperlipidemia)    Hx of transient ischemic attack (TIA) 04/16/2015    Past Surgical History:  Procedure Laterality Date   COLONOSCOPY WITH PROPOFOL N/A 03/29/2017   Procedure: COLONOSCOPY WITH PROPOFOL;  Surgeon: Midge Minium, MD;  Location: [facility] ENDOSCOPY;  Service: Endoscopy;  Laterality: N/A;   COLONOSCOPY WITH PROPOFOL N/A 07/27/2022   Procedure: COLONOSCOPY WITH PROPOFOL;  Surgeon: Midge Minium, MD;  Location: [facility] ENDOSCOPY;  Service: Endoscopy;  Laterality: N/A;   KNEE SURGERY Left  2005    Family History  Problem Relation Age of Onset   Cancer Father        Stomach    Heart disease Father    Diabetes Maternal Grandfather    Alcohol abuse Paternal Uncle    Obesity Brother     Social History   Tobacco Use   Smoking status: Former    Types: Pipe    Start date: 08/10/2003    Quit date: 08/09/2013    Years since quitting: 10.0   Smokeless tobacco: Never  Substance Use Topics   Alcohol use: Yes    Alcohol/week: 1.0 standard drink of alcohol    Types: 1 Glasses of wine per week    Comment: wine with dinner very occasionally     Current  Outpatient Medications:    AMBULATORY NON FORMULARY MEDICATION, Trimix (30/1/10)-(Pap/Phent/PGE)  Test Dose  1ml vial   Qty #3 Refills 0  [facility] [phone] Fax [phone], Disp: 1 mL, Rfl: 0   atorvastatin (LIPITOR) 80 MG tablet, Take 1 tablet (80 mg total) by mouth daily., Disp: 30 tablet, Rfl: 5   clopidogrel (PLAVIX) 75 MG tablet, Take 1 tablet (75 mg total) by mouth daily., Disp: 30 tablet, Rfl: 5   diphenhydrAMINE (BENADRYL) 12.5 MG/5ML elixir, Take by mouth., Disp: , Rfl:    fluticasone (FLONASE) 50 MCG/ACT nasal spray, USE TWO SPRAYS IN THE AFFECTED NOSTRIL ONE TIME DAILY, Disp: 16 mL, Rfl: 0   lisinopril (ZESTRIL) 5 MG tablet, Take 1 tablet (5 mg total) by mouth daily., Disp: 30 tablet, Rfl: 5   pantoprazole (PROTONIX) 20 MG tablet, Take 1 tablet (20 mg total) by mouth daily before breakfast., Disp: 30 tablet, Rfl: 5   sildenafil (VIAGRA) 100 MG tablet, TAKE ONE TABLET BY MOUTH ONE TIME DAILY AS NEEDED FOR ERECTILE DYSFUNCTION, Disp: 30 tablet, Rfl: 0   ezetimibe (ZETIA) 10 MG tablet, Take 1 tablet (10 mg total) by mouth daily. (Patient not taking: Reported on 08/25/2023), Disp: 30 tablet, Rfl: 5  Allergies  Allergen Reactions   Penicillins Hives and Rash    I personally reviewed active problem list, medication list, allergies, family history with the patient/caregiver today.   ROS  Ten systems reviewed and is negative except as mentioned in HPI    Objective  Vitals:   08/25/23 0911  BP: 118/76  Pulse: 73  Resp: 16  Temp: 97.9 F (36.6 C)  TempSrc: Oral  SpO2: 97%  Weight: 173 lb (78.5 kg)  Height: 6' (1.829 m)    Body mass index is 23.46 kg/m.  Physical Exam  Constitutional: Patient appears well-developed and well-nourished.  No distress.  HEENT: head atraumatic, normocephalic, pupils equal and reactive to light, neck supple Cardiovascular: Normal rate, regular rhythm and normal heart sounds.  No murmur heard. No BLE edema. Pulmonary/Chest:  Effort normal and breath sounds normal. No respiratory distress. Abdominal: Soft.  There is no tenderness. Psychiatric: Patient has a normal mood and affect. behavior is normal. Judgment and thought content normal.   Diabetic Foot Exam:     PHQ2/9:    08/25/2023    9:01 AM 02/18/2023    4:23 PM 02/03/2023    7:45 AM 09/06/2022    1:51 PM 07/27/2021    3:49 PM  Depression screen PHQ 2/9  Decreased Interest 0 0 0 0 0  Down, Depressed, Hopeless 0 0 1 0 0  PHQ - 2 Score 0 0 1 0 0  Altered sleeping 0 0 0 0 0  Tired, decreased energy 0 0 0 0 0  Change in appetite 0 0 0 0 0  Feeling bad or failure about yourself  0 0 0 0 0  Trouble concentrating 0 0 0 0 0  Moving slowly or fidgety/restless 0 0 0 0 0  Suicidal thoughts 0 0 0 0 0  PHQ-9 Score 0 0 1 0 0  Difficult doing work/chores Not difficult at all    Not difficult at all    phq 9 is negative  Fall Risk:    08/25/2023    9:01 AM 02/18/2023    4:01 PM 02/03/2023    7:45 AM 09/06/2022    1:51 PM 07/27/2021    3:49 PM  Fall Risk   Falls in the past year? 0 0 0 0 0  Number falls in past yr: 0  0 0 0  Injury with Fall? 0  0 0 0  Risk for fall due to : No Fall Risks No Fall Risks No Fall Risks No Fall Risks   Follow up Falls prevention discussed;Education provided;Falls evaluation completed Falls prevention discussed Falls prevention discussed Falls prevention discussed      Assessment & Plan  1. Aortic dilatation (HCC) (Primary)  Continue regular follow up with cardiologist   2. Senile purpura (HCC)  Reassurance given   3. History of alcoholism (HCC)  In remission  4. Pure hypercholesterolemia  Needs to resume zetia   5. BPH with obstruction/lower urinary tract symptoms  He is not on medication , mild symptoms   6. ED (erectile dysfunction) of organic origin  Advised to contact Urologist and discuss penile pump  7. Hypertension, benign  - lisinopril (ZESTRIL) 5 MG tablet; Take 1 tablet (5 mg total) by mouth  daily.  Dispense: 30 tablet; Refill: 5  8. GERD without esophagitis  - pantoprazole (PROTONIX) 20 MG tablet; Take 1 tablet (20 mg total) by mouth daily before breakfast.  Dispense: 30 tablet; Refill: 5  9. Hx of transient ischemic attack (TIA)  - clopidogrel (PLAVIX) 75 MG tablet; Take 1 tablet (75 mg total) by mouth daily.  Dispense: 30 tablet; Refill: 5 - ezetimibe (ZETIA) 10 MG tablet; Take 1 tablet (10 mg total) by mouth daily.  Dispense: 30 tablet; Refill: 5

## 2024-01-19 ENCOUNTER — Telehealth: Payer: Self-pay

## 2024-01-19 NOTE — Telephone Encounter (Signed)
 Copied from CRM [id number]. Topic: Clinical - Medical Advice >> Jan 19, 2024 11:36 AM Rosamond Comes wrote: Reason for CRM: patient wife Pattie Borders calling asking for vaccine records Hepatitis B MMR and Pneumonia   Please call Heather at [phone]

## 2024-01-19 NOTE — Telephone Encounter (Signed)
 Spoke to patient wife and he does have most updated pneumonia but no records of MMR and Hep B. Patient wife would like to know if you recommend for him to get those vaccines again? Or if he can get blood work done to check for it?  [place] are [place] seas and would like to know your advice.

## 2024-01-20 ENCOUNTER — Other Ambulatory Visit: Payer: Self-pay | Admitting: Family Medicine

## 2024-01-20 DIAGNOSIS — Z789 Other specified health status: Secondary | ICD-10-CM

## 2024-01-20 NOTE — Telephone Encounter (Unsigned)
 Copied from CRM [id number]. Topic: Clinical - Medication Question >> Jan 20, 2024 11:07 AM Juluis Ok wrote: Reason for CRM: Patient's spouse, Pattie Borders, states that they need MMR vaccine prescription sent to pharmacy. She request a callback when completed.   [facility] [address] [facility] - [place], [place] - [address] AT [facility] [address] [place] [address] Phone: [phone] Fax: [phone] Hours: Not open 24 hours

## 2024-01-20 NOTE — Telephone Encounter (Signed)
 Patient will be traveling around September, they will contact pharmacies or health department to get vaccines.

## 2024-01-20 NOTE — Telephone Encounter (Signed)
 Patient wife notified.

## 2024-01-23 ENCOUNTER — Other Ambulatory Visit: Payer: Self-pay | Admitting: Family Medicine

## 2024-01-23 ENCOUNTER — Telehealth: Payer: Self-pay

## 2024-01-23 DIAGNOSIS — Z789 Other specified health status: Secondary | ICD-10-CM

## 2024-01-23 NOTE — Telephone Encounter (Unsigned)
 Copied from CRM [id number]. Topic: Clinical - Medication Question >> Jan 20, 2024 11:07 AM Juluis Ok wrote: Reason for CRM: Patient's spouse, Pattie Borders, states that they need MMR vaccine prescription sent to pharmacy. She request a callback when completed.   [facility] [address] [facility] - [place], [place] - [address] AT [facility] [address] [place] [address] Phone: [phone] Fax: [phone] Hours: Not open 24 hours >> Jan 23, 2024 10:53 AM Emylou G wrote: Wife called.. [facility] adv never rcvd fax auth for MMR.. Can you refax please??  He is going out of the house.. if Dee Farber needs to come by he can.Aaron Aas Pls call if he can come by?

## 2024-01-24 NOTE — Telephone Encounter (Signed)
 Left detailed vm

## 2024-01-25 ENCOUNTER — Telehealth: Payer: Self-pay | Admitting: Family Medicine

## 2024-01-25 NOTE — Telephone Encounter (Signed)
Order placed up front

## 2024-01-25 NOTE — Telephone Encounter (Signed)
 Copied from CRM [id number]. Topic: General - Other >> Jan 25, 2024  9:32 AM Tiffany S wrote: Reason for CRM: Patient will come to pick up MMR script aroung 4pm today

## 2024-02-16 ENCOUNTER — Encounter: Payer: Self-pay | Admitting: Family Medicine

## 2024-02-23 ENCOUNTER — Ambulatory Visit: Payer: BC Managed Care – PPO | Admitting: Family Medicine

## 2024-03-09 ENCOUNTER — Telehealth: Payer: Self-pay | Admitting: Family Medicine

## 2024-03-09 DIAGNOSIS — N528 Other male erectile dysfunction: Secondary | ICD-10-CM

## 2024-03-09 NOTE — Telephone Encounter (Signed)
sildenafil (VIAGRA) 100 MG tablet

## 2024-03-09 NOTE — Telephone Encounter (Signed)
 Pt needs an appt for further refills. Has not been since Jan 2025

## 2024-03-09 NOTE — Telephone Encounter (Signed)
 Pt needed a Friday appointment and Dr Glenard was booked until end of September. I did offer him to see someone else and he declined. Stated he will wait until he return from his trip in September to schedule

## 2024-07-20 ENCOUNTER — Encounter: Admitting: Family Medicine

## 2024-08-22 ENCOUNTER — Telehealth: Payer: Self-pay | Admitting: Family Medicine

## 2024-08-22 NOTE — Telephone Encounter (Signed)
clopidogrel (PLAVIX) 75 MG tablet

## 2024-08-22 NOTE — Telephone Encounter (Signed)
Pt needs an appt with PCP

## 2024-08-22 NOTE — Telephone Encounter (Signed)
 Pt has appt sch'd for January 23rd

## 2024-08-22 NOTE — Telephone Encounter (Signed)
 ezetimibe (ZETIA) 10 MG tablet

## 2024-08-31 ENCOUNTER — Ambulatory Visit: Admitting: Family Medicine

## 2024-09-05 ENCOUNTER — Encounter: Payer: Self-pay | Admitting: Family Medicine

## 2024-09-05 ENCOUNTER — Ambulatory Visit: Admitting: Family Medicine

## 2024-09-05 VITALS — BP 134/86 | HR 78 | Resp 16 | Ht 72.0 in | Wt 175.2 lb

## 2024-09-05 DIAGNOSIS — Z8673 Personal history of transient ischemic attack (TIA), and cerebral infarction without residual deficits: Secondary | ICD-10-CM | POA: Diagnosis not present

## 2024-09-05 DIAGNOSIS — N138 Other obstructive and reflux uropathy: Secondary | ICD-10-CM | POA: Diagnosis not present

## 2024-09-05 DIAGNOSIS — F1021 Alcohol dependence, in remission: Secondary | ICD-10-CM | POA: Diagnosis not present

## 2024-09-05 DIAGNOSIS — I10 Essential (primary) hypertension: Secondary | ICD-10-CM

## 2024-09-05 DIAGNOSIS — Z131 Encounter for screening for diabetes mellitus: Secondary | ICD-10-CM

## 2024-09-05 DIAGNOSIS — I7121 Aneurysm of the ascending aorta, without rupture: Secondary | ICD-10-CM | POA: Diagnosis not present

## 2024-09-05 DIAGNOSIS — K219 Gastro-esophageal reflux disease without esophagitis: Secondary | ICD-10-CM | POA: Diagnosis not present

## 2024-09-05 DIAGNOSIS — E7800 Pure hypercholesterolemia, unspecified: Secondary | ICD-10-CM

## 2024-09-05 DIAGNOSIS — N401 Enlarged prostate with lower urinary tract symptoms: Secondary | ICD-10-CM

## 2024-09-05 MED ORDER — ATORVASTATIN CALCIUM 80 MG PO TABS: 80.0000 mg | ORAL_TABLET | Freq: Every day | ORAL | 5 refills | Status: AC

## 2024-09-05 MED ORDER — LISINOPRIL 5 MG PO TABS: 5.0000 mg | ORAL_TABLET | Freq: Every day | ORAL | 5 refills | Status: AC

## 2024-09-05 MED ORDER — EZETIMIBE 10 MG PO TABS: 10.0000 mg | ORAL_TABLET | Freq: Every day | ORAL | 5 refills | Status: AC

## 2024-09-05 MED ORDER — PANTOPRAZOLE SODIUM 20 MG PO TBEC: 20.0000 mg | DELAYED_RELEASE_TABLET | Freq: Every day | ORAL | 5 refills | Status: AC

## 2024-09-05 MED ORDER — CLOPIDOGREL BISULFATE 75 MG PO TABS: 75.0000 mg | ORAL_TABLET | Freq: Every day | ORAL | 5 refills | Status: AC

## 2024-09-05 NOTE — Progress Notes (Signed)
 Name: Raymond Chambers   MRN: [id number]    DOB: February 13, 1956   Date:09/05/2024       Progress Note  Subjective  Chief Complaint  Chief Complaint  Patient presents with   Medical Management of Chronic Issues   Discussed the use of AI scribe software for clinical note transcription with the patient, who gave verbal consent to proceed.  History of Present Illness Raymond Chambers is a 69 year old male who presents for medication management and follow-up.  He is currently taking lisinopril  5 mg for hypertension, atorvastatin  and ezetimibe  for hypercholesterolemia, and clopidogrel  due to his history of TIA. He experiences easy bruising, which he attributes to the clopidogrel . No issues with muscle aches from his cholesterol medications.  He takes Cialis  for erectile dysfunction, which he finds more effective than Viagra . He denies any side effects from Cialis  and has two more refills available at [facility] pharmacy.  For gastroesophageal reflux disease (GERD), he is taking pantoprazole  20 mg. He experiences acid reflux two to three times a week, particularly when eating late at night or consuming certain foods like spaghetti or ice cream. He sometimes takes antacids in addition to pantoprazole  to manage these symptoms.  He has a history of an ascending aortic aneurysm and sees Dr. Camellia Maxcy for this condition.  He has a history of benign prostatic hyperplasia (BPH) but reports no issues with urinary flow or pain, although he does get up once or twice a night to urinate.  He has a left inguinal hernia that has not been repaired and is not currently causing him discomfort.  He is retired as of December 2nd and is adjusting to a new routine. He is aware of the risk of increased alcohol consumption post-retirement due to his past history of heavy drinking but is actively managing this risk. He is a member of [employer] and is exploring classes to maintain physical activity.  He is interested in losing  weight     Patient Active Problem List   Diagnosis Date Noted   Aortic dilatation 02/18/2023   Left inguinal hernia 02/03/2023   Diverticulosis 09/06/2022   Senile purpura 09/06/2022   Grade I internal hemorrhoids 09/06/2022   Aortic root dilation 05/01/2021   BPH associated with nocturia 03/24/2018   Rectal polyp    Mild aortic regurgitation 11/26/2016   History of alcoholism (HCC) 04/17/2015   ED (erectile dysfunction) of non-organic origin 04/17/2015   Acid reflux 04/17/2015   Herniation of nucleus pulposus 04/17/2015   Bilateral inguinal hernia 04/17/2015   H/O malignant neoplasm of skin 04/17/2015   Bicuspid aortic valve 04/17/2015   HLD (hyperlipidemia)    Hx of transient ischemic attack (TIA) 04/16/2015    Past Surgical History:  Procedure Laterality Date   COLONOSCOPY WITH PROPOFOL  N/A 03/29/2017   Procedure: COLONOSCOPY WITH PROPOFOL ;  Surgeon: Jinny Carmine, MD;  Location: [facility] ENDOSCOPY;  Service: Endoscopy;  Laterality: N/A;   COLONOSCOPY WITH PROPOFOL  N/A 07/27/2022   Procedure: COLONOSCOPY WITH PROPOFOL ;  Surgeon: Jinny Carmine, MD;  Location: [facility] ENDOSCOPY;  Service: Endoscopy;  Laterality: N/A;   KNEE SURGERY Left 2005    Family History  Problem Relation Age of Onset   Cancer Father        Stomach    Heart disease Father    Diabetes Maternal Grandfather    Alcohol abuse Paternal Uncle    Obesity Brother     Social History   Tobacco Use   Smoking status: Former  Types: Pipe    Start date: 08/10/2003    Quit date: 08/09/2013    Years since quitting: 11.0   Smokeless tobacco: Never  Substance Use Topics   Alcohol use: Yes    Alcohol/week: 1.0 standard drink of alcohol    Types: 1 Glasses of wine per week    Comment: wine with dinner very occasionally    Current Medications[1]  Allergies[2]  I personally reviewed active problem list, medication list, allergies, family history with the patient/caregiver today.   ROS  Ten systems reviewed and  is negative except as mentioned in HPI    Objective Physical Exam CONSTITUTIONAL: Patient appears well-developed and well-nourished.  No distress. HEENT: Head atraumatic, normocephalic, neck supple. CARDIOVASCULAR: Normal rate, regular rhythm and normal heart sounds.  No murmur heard. No BLE edema. PULMONARY: Effort normal and breath sounds normal. No respiratory distress. ABDOMINAL: There is no tenderness or distention. MUSCULOSKELETAL: Normal gait. Without gross motor or sensory deficit. PSYCHIATRIC: Patient has a normal mood and affect. behavior is normal. Judgment and thought content normal.  Vitals:   09/05/24 1312  BP: 134/86  Pulse: 78  Resp: 16  SpO2: 94%  Weight: 175 lb 3.2 oz (79.5 kg)  Height: 6' (1.829 m)    Body mass index is 23.76 kg/m.   PHQ2/9:    09/05/2024    1:08 PM 08/25/2023    9:01 AM 02/18/2023    4:23 PM 02/03/2023    7:45 AM 09/06/2022    1:51 PM  Depression screen PHQ 2/9  Decreased Interest 0 0 0 0 0  Down, Depressed, Hopeless 0 0 0 1 0  PHQ - 2 Score 0 0 0 1 0  Altered sleeping  0 0 0 0  Tired, decreased energy  0 0 0 0  Change in appetite  0 0 0 0  Feeling bad or failure about yourself   0 0 0 0  Trouble concentrating  0 0 0 0  Moving slowly or fidgety/restless  0 0 0 0  Suicidal thoughts  0 0 0 0  PHQ-9 Score  0  0  1  0   Difficult doing work/chores  Not difficult at all        Data saved with a previous flowsheet row definition    phq 9 is negative  Fall Risk:    09/05/2024    1:08 PM 08/25/2023    9:01 AM 02/18/2023    4:01 PM 02/03/2023    7:45 AM 09/06/2022    1:51 PM  Fall Risk   Falls in the past year? 0 0 0 0 0  Number falls in past yr: 0 0  0 0  Injury with Fall? 0 0   0  0   Risk for fall due to : No Fall Risks No Fall Risks No Fall Risks No Fall Risks No Fall Risks  Follow up Falls evaluation completed Falls prevention discussed;Education provided;Falls evaluation completed Falls prevention discussed Falls prevention  discussed Falls prevention discussed     Data saved with a previous flowsheet row definition    Assessment & Plan Essential hypertension Blood pressure controlled on lisinopril  5 mg daily. - Continue lisinopril  5 mg daily.  Gastroesophageal reflux disease Acid reflux 2-3 times weekly, related to diet and timing of meals. Advised against increasing pantoprazole  dose. - Continue pantoprazole  20 mg daily. - Advised taking Pepcid  or Tums before bedtime if eating late or consuming trigger foods. - Recommended lifestyle modifications: avoid eating late, spicy foods,  and raise head of bed.  Pure hypercholesterolemia On atorvastatin  and ezetimibe . No side effects or adherence issues. - Continue atorvastatin  and ezetimibe .  Aortic ectasia Ascending aortic aneurysm. Last CT angiogram in 2023. Pending follow-up with Dr. Mona. Discussed potential Plavix  discontinuation. - Schedule follow-up with Dr. Mona for CT angiogram and discussion on Plavix  continuation. - Continue Plavix  until further evaluation by Dr. Mona.  History of transient ischemic attack (TIA) Last TIA in 2016. On Plavix . Discussed potential discontinuation of Plavix . - Consult with Dr. Mona regarding continuation of Plavix . - Will consider switching to aspirin  if Plavix  is discontinued.  Benign prostatic hyperplasia with lower urinary tract symptoms No issues with urinary flow or pain. Nocturia once or twice a night. Symptoms well-managed. - Continue current management.  Erectile dysfunction Better response to Cialis  compared to Viagra . No side effects. - Continue Cialis  as needed.  Alcohol dependence, in remission In remission. Aware of relapse risk during transition to retirement. Actively avoiding alcohol. - Encouraged continued abstinence from alcohol. - Supported establishment of new routines and hobbies.  General health maintenance Discussed importance of routine post-retirement to prevent alcohol relapse and  maintain health. - Encouraged establishing a daily routine including exercise and social activities. - Recommended exploring hobbies and volunteer opportunities. - Advised on Mediterranean diet for cholesterol management. - Encouraged regular physical activity, at least twice a week.        [1]  Current Outpatient Medications:    AMBULATORY NON FORMULARY MEDICATION, Trimix (30/1/10)-(Pap/Phent/PGE)  Test Dose  1ml vial   Qty #3 Refills 0  [facility] [phone] Fax [phone], Disp: 1 mL, Rfl: 0   atorvastatin  (LIPITOR) 80 MG tablet, Take 1 tablet (80 mg total) by mouth daily., Disp: 30 tablet, Rfl: 5   clopidogrel  (PLAVIX ) 75 MG tablet, Take 1 tablet (75 mg total) by mouth daily., Disp: 30 tablet, Rfl: 5   diphenhydrAMINE (BENADRYL) 12.5 MG/5ML elixir, Take by mouth., Disp: , Rfl:    ezetimibe  (ZETIA ) 10 MG tablet, Take 1 tablet (10 mg total) by mouth daily., Disp: 30 tablet, Rfl: 5   fluticasone  (FLONASE ) 50 MCG/ACT nasal spray, Place 2 sprays into both nostrils daily., Disp: 16 mL, Rfl: 5   lisinopril  (ZESTRIL ) 5 MG tablet, Take 1 tablet (5 mg total) by mouth daily., Disp: 30 tablet, Rfl: 5   pantoprazole  (PROTONIX ) 20 MG tablet, Take 1 tablet (20 mg total) by mouth daily before breakfast., Disp: 30 tablet, Rfl: 5   tadalafil  (CIALIS ) 10 MG tablet, Take 10 mg by mouth daily as needed., Disp: , Rfl:    sildenafil  (VIAGRA ) 100 MG tablet, TAKE ONE TABLET BY MOUTH ONE TIME DAILY AS NEEDED FOR ERECTILE DYSFUNCTION (Patient not taking: Reported on 09/05/2024), Disp: 30 tablet, Rfl: 0 [2]  Allergies Allergen Reactions   Penicillins Hives and Rash

## 2024-09-06 LAB — COMPREHENSIVE METABOLIC PANEL WITH GFR
AG Ratio: 1.7 (calc) (ref 1.0–2.5)
ALT: 15 U/L (ref 9–46)
AST: 16 U/L (ref 10–35)
Albumin: 4.6 g/dL (ref 3.6–5.1)
Alkaline phosphatase (APISO): 68 U/L (ref 35–144)
BUN/Creatinine Ratio: 32 (calc) — ABNORMAL HIGH (ref 6–22)
BUN: 28 mg/dL — ABNORMAL HIGH (ref 7–25)
CO2: 28 mmol/L (ref 20–32)
Calcium: 9.4 mg/dL (ref 8.6–10.3)
Chloride: 104 mmol/L (ref 98–110)
Creat: 0.87 mg/dL (ref 0.70–1.35)
Globulin: 2.7 g/dL (ref 1.9–3.7)
Glucose, Bld: 86 mg/dL (ref 65–99)
Potassium: 4.7 mmol/L (ref 3.5–5.3)
Sodium: 139 mmol/L (ref 135–146)
Total Bilirubin: 0.6 mg/dL (ref 0.2–1.2)
Total Protein: 7.3 g/dL (ref 6.1–8.1)
eGFR: 94 mL/min/{1.73_m2}

## 2024-09-06 LAB — LIPID PANEL
Cholesterol: 246 mg/dL — ABNORMAL HIGH
HDL: 79 mg/dL
LDL Cholesterol (Calc): 148 mg/dL — ABNORMAL HIGH
Non-HDL Cholesterol (Calc): 167 mg/dL — ABNORMAL HIGH
Total CHOL/HDL Ratio: 3.1 (calc)
Triglycerides: 86 mg/dL

## 2024-09-06 LAB — CBC WITH DIFFERENTIAL/PLATELET
Absolute Lymphocytes: 1818 {cells}/uL (ref 850–3900)
Absolute Monocytes: 533 {cells}/uL (ref 200–950)
Basophils Absolute: 50 {cells}/uL (ref 0–200)
Basophils Relative: 0.7 %
Eosinophils Absolute: 99 {cells}/uL (ref 15–500)
Eosinophils Relative: 1.4 %
HCT: 44.9 % (ref 39.4–51.1)
Hemoglobin: 14.9 g/dL (ref 13.2–17.1)
MCH: 28.8 pg (ref 27.0–33.0)
MCHC: 33.2 g/dL (ref 31.6–35.4)
MCV: 86.8 fL (ref 81.4–101.7)
MPV: 9.8 fL (ref 7.5–12.5)
Monocytes Relative: 7.5 %
Neutro Abs: 4601 {cells}/uL (ref 1500–7800)
Neutrophils Relative %: 64.8 %
Platelets: 236 10*3/uL (ref 140–400)
RBC: 5.17 Million/uL (ref 4.20–5.80)
RDW: 13 % (ref 11.0–15.0)
Total Lymphocyte: 25.6 %
WBC: 7.1 10*3/uL (ref 3.8–10.8)

## 2024-09-06 LAB — PSA: PSA: 2.24 ng/mL

## 2024-09-06 LAB — HEMOGLOBIN A1C
Hgb A1c MFr Bld: 5.4 %
Mean Plasma Glucose: 108 mg/dL
eAG (mmol/L): 6 mmol/L

## 2024-09-07 ENCOUNTER — Ambulatory Visit: Payer: Self-pay | Admitting: Family Medicine

## 2024-09-10 ENCOUNTER — Other Ambulatory Visit: Payer: Self-pay | Admitting: Family Medicine

## 2025-03-14 ENCOUNTER — Ambulatory Visit: Admitting: Family Medicine

## 2025-03-18 ENCOUNTER — Ambulatory Visit: Admitting: Family Medicine
